# Patient Record
Sex: Female | Born: 1975 | Race: White | Hispanic: No | Marital: Married | Smoking: Never smoker
Health system: Southern US, Community
[De-identification: ages and names within clinical notes are randomized; demographics above are authoritative.]

## PROBLEM LIST (undated history)

## (undated) DIAGNOSIS — R079 Chest pain, unspecified: Secondary | ICD-10-CM

## (undated) DIAGNOSIS — F419 Anxiety disorder, unspecified: Secondary | ICD-10-CM

## (undated) DIAGNOSIS — K829 Disease of gallbladder, unspecified: Secondary | ICD-10-CM

## (undated) DIAGNOSIS — J302 Other seasonal allergic rhinitis: Secondary | ICD-10-CM

## (undated) DIAGNOSIS — E039 Hypothyroidism, unspecified: Secondary | ICD-10-CM

## (undated) DIAGNOSIS — T7840XA Allergy, unspecified, initial encounter: Secondary | ICD-10-CM

## (undated) DIAGNOSIS — G4730 Sleep apnea, unspecified: Secondary | ICD-10-CM

## (undated) DIAGNOSIS — E079 Disorder of thyroid, unspecified: Secondary | ICD-10-CM

## (undated) DIAGNOSIS — H269 Unspecified cataract: Secondary | ICD-10-CM

## (undated) DIAGNOSIS — I471 Supraventricular tachycardia, unspecified: Secondary | ICD-10-CM

## (undated) DIAGNOSIS — I493 Ventricular premature depolarization: Secondary | ICD-10-CM

## (undated) DIAGNOSIS — M2550 Pain in unspecified joint: Secondary | ICD-10-CM

## (undated) DIAGNOSIS — G4733 Obstructive sleep apnea (adult) (pediatric): Secondary | ICD-10-CM

## (undated) DIAGNOSIS — M549 Dorsalgia, unspecified: Secondary | ICD-10-CM

## (undated) DIAGNOSIS — R002 Palpitations: Secondary | ICD-10-CM

## (undated) DIAGNOSIS — E559 Vitamin D deficiency, unspecified: Secondary | ICD-10-CM

## (undated) DIAGNOSIS — E669 Obesity, unspecified: Secondary | ICD-10-CM

## (undated) DIAGNOSIS — C801 Malignant (primary) neoplasm, unspecified: Secondary | ICD-10-CM

## (undated) DIAGNOSIS — D649 Anemia, unspecified: Secondary | ICD-10-CM

## (undated) HISTORY — DX: Palpitations: R00.2

## (undated) HISTORY — DX: Dorsalgia, unspecified: M54.9

## (undated) HISTORY — DX: Morbid (severe) obesity due to excess calories: E66.01

## (undated) HISTORY — DX: Obstructive sleep apnea (adult) (pediatric): G47.33

## (undated) HISTORY — DX: Chest pain, unspecified: R07.9

## (undated) HISTORY — DX: Anemia, unspecified: D64.9

## (undated) HISTORY — PX: SPINE SURGERY: SHX786

## (undated) HISTORY — DX: Supraventricular tachycardia: I47.1

## (undated) HISTORY — DX: Unspecified cataract: H26.9

## (undated) HISTORY — DX: Other seasonal allergic rhinitis: J30.2

## (undated) HISTORY — DX: Supraventricular tachycardia, unspecified: I47.10

## (undated) HISTORY — DX: Sleep apnea, unspecified: G47.30

## (undated) HISTORY — DX: Hypothyroidism, unspecified: E03.9

## (undated) HISTORY — DX: Vitamin D deficiency, unspecified: E55.9

## (undated) HISTORY — DX: Allergy, unspecified, initial encounter: T78.40XA

## (undated) HISTORY — DX: Anxiety disorder, unspecified: F41.9

## (undated) HISTORY — DX: Disorder of thyroid, unspecified: E07.9

## (undated) HISTORY — DX: Ventricular premature depolarization: I49.3

## (undated) HISTORY — DX: Obesity, unspecified: E66.9

## (undated) HISTORY — DX: Pain in unspecified joint: M25.50

## (undated) HISTORY — DX: Disease of gallbladder, unspecified: K82.9

---

## 2015-03-31 HISTORY — PX: BREAST BIOPSY: SHX20

## 2015-06-12 DIAGNOSIS — E662 Morbid (severe) obesity with alveolar hypoventilation: Secondary | ICD-10-CM | POA: Insufficient documentation

## 2015-07-25 DIAGNOSIS — K828 Other specified diseases of gallbladder: Secondary | ICD-10-CM | POA: Insufficient documentation

## 2015-07-25 HISTORY — DX: Other specified diseases of gallbladder: K82.8

## 2016-03-30 HISTORY — PX: CHOLECYSTECTOMY: SHX55

## 2018-03-30 DIAGNOSIS — C801 Malignant (primary) neoplasm, unspecified: Secondary | ICD-10-CM

## 2018-03-30 DIAGNOSIS — C4491 Basal cell carcinoma of skin, unspecified: Secondary | ICD-10-CM

## 2018-03-30 HISTORY — DX: Basal cell carcinoma of skin, unspecified: C44.91

## 2018-03-30 HISTORY — DX: Malignant (primary) neoplasm, unspecified: C80.1

## 2018-03-30 HISTORY — PX: MOHS SURGERY: SUR867

## 2019-08-21 DIAGNOSIS — D2339 Other benign neoplasm of skin of other parts of face: Secondary | ICD-10-CM | POA: Diagnosis not present

## 2019-08-21 DIAGNOSIS — L821 Other seborrheic keratosis: Secondary | ICD-10-CM | POA: Diagnosis not present

## 2019-08-21 DIAGNOSIS — Z0000 Encounter for general adult medical examination without abnormal findings: Secondary | ICD-10-CM | POA: Diagnosis not present

## 2019-08-21 DIAGNOSIS — L814 Other melanin hyperpigmentation: Secondary | ICD-10-CM | POA: Diagnosis not present

## 2019-08-21 DIAGNOSIS — Z7189 Other specified counseling: Secondary | ICD-10-CM | POA: Diagnosis not present

## 2019-09-29 DIAGNOSIS — E785 Hyperlipidemia, unspecified: Secondary | ICD-10-CM | POA: Diagnosis not present

## 2019-09-29 DIAGNOSIS — E559 Vitamin D deficiency, unspecified: Secondary | ICD-10-CM | POA: Diagnosis not present

## 2019-09-29 DIAGNOSIS — F418 Other specified anxiety disorders: Secondary | ICD-10-CM | POA: Diagnosis not present

## 2020-02-09 ENCOUNTER — Other Ambulatory Visit: Payer: Self-pay

## 2020-02-09 ENCOUNTER — Telehealth: Payer: Self-pay | Admitting: Medical

## 2020-02-09 NOTE — Patient Instructions (Signed)

## 2020-02-09 NOTE — Progress Notes (Addendum)
   Subjective:    Patient ID: Holly Johnson, female    DOB: 1975-08-30, 44 y.o.   MRN: [id number]  HPI 44 yo female in non acute distress consents to telemedicine appointment. She tested positive today with Covid-19. Her children had been ill and they tested postive for Covid -19 on Tuesday11-9. Last night patient had a HA, dizziness and fatigue. She denies fever/chills, cough , shortness of [place] or chest pain or loss of taste or smell. She had contacted [facility] and they guided her to talk to [facility] for guidance on returning to work.   No Known Allergies   PMHx/Meds hypiothyroidism levothyroxine Anxiety Fluoxetine    Review of Systems  Constitutional: Positive for fatigue. Negative for chills and fever.  HENT: Negative.   Respiratory: Negative.   Cardiovascular: Negative.   Neurological: Positive for dizziness and headaches.        Objective:   Physical Exam [id number] No physical examination performed due to telemedicne appointment.       Assessment & Plan:  Covid -19 postiive Symptoms started on 02-07-2020 may return to work on 02-19-2020 if symptoms are improving and patient has  Had no  a fever in 24 hours with no Motrin or Tylneol on board.  Patient verbalizes understanding and has no questions at the end of our conversation. Encouraged patient to set up MyChart for easier communication and to share AVS with patient.

## 2020-02-16 DIAGNOSIS — U071 COVID-19: Secondary | ICD-10-CM | POA: Diagnosis not present

## 2020-05-10 DIAGNOSIS — Z23 Encounter for immunization: Secondary | ICD-10-CM | POA: Diagnosis not present

## 2020-05-13 ENCOUNTER — Other Ambulatory Visit: Payer: Self-pay

## 2020-05-13 ENCOUNTER — Other Ambulatory Visit (HOSPITAL_COMMUNITY)
Admission: RE | Admit: 2020-05-13 | Discharge: 2020-05-13 | Disposition: A | Discharge status: Home / Self Care | Payer: BC Managed Care – PPO | Source: Ambulatory Visit | Attending: Certified Nurse Midwife | Admitting: Certified Nurse Midwife

## 2020-05-13 ENCOUNTER — Ambulatory Visit (INDEPENDENT_AMBULATORY_CARE_PROVIDER_SITE_OTHER): Payer: Self-pay | Admitting: Certified Nurse Midwife

## 2020-05-13 ENCOUNTER — Encounter: Payer: Self-pay | Admitting: Certified Nurse Midwife

## 2020-05-13 VITALS — BP 135/96 | HR 66 | Ht 66.5 in | Wt 273.0 lb

## 2020-05-13 DIAGNOSIS — Z124 Encounter for screening for malignant neoplasm of cervix: Secondary | ICD-10-CM

## 2020-05-13 DIAGNOSIS — Z01419 Encounter for gynecological examination (general) (routine) without abnormal findings: Secondary | ICD-10-CM

## 2020-05-13 DIAGNOSIS — Z975 Presence of (intrauterine) contraceptive device: Secondary | ICD-10-CM

## 2020-05-13 DIAGNOSIS — Z1159 Encounter for screening for other viral diseases: Secondary | ICD-10-CM

## 2020-05-13 DIAGNOSIS — N921 Excessive and frequent menstruation with irregular cycle: Secondary | ICD-10-CM

## 2020-05-13 DIAGNOSIS — Z1231 Encounter for screening mammogram for malignant neoplasm of breast: Secondary | ICD-10-CM

## 2020-05-13 NOTE — Progress Notes (Signed)
ANNUAL PREVENTATIVE CARE GYN  ENCOUNTER NOTE  Subjective:       Holly Johnson is a 45 y.o. [phone] female here for a routine annual gynecologic exam.  Current complaints: 1. Breakthrough bleeding with IUD, occasional since daughter started menses  Establishing care, relocated for [place], [place].   Denies difficulty breathing or respiratory distress, chest pain, abdominal pain, excessive vaginal bleeding, dysuria, and leg pain or swelling.    Gynecologic History  No LMP recorded (lmp unknown). (Menstrual status: IUD).  Contraception: IUD, Mirena; inserted approximately five (5) years ago  Last Pap: due.   Last mammogram: due.   Obstetric History  OB History  Gravida Para Term Preterm AB Living  2 2 2     2   SAB IAB Ectopic Multiple Live Births          2    # Outcome Date GA Lbr Len/2nd Weight Sex Delivery Anes PTL Lv  2 Term 02/14/10   8 lb 4 oz (3.742 kg) F Vag-Spont   LIV  1 Term 06/19/08   7 lb 15 oz (3.6 kg) F Vag-Spont   LIV    Past Medical History:  Diagnosis Date  . Anxiety   . Thyroid disease     Past Surgical History:  Procedure Laterality Date  . BREAST BIOPSY Right   . GALLBLADDER SURGERY    . MOHS SURGERY     x 3    Current Outpatient Medications on File Prior to Visit  Medication Sig Dispense Refill  . BIOTIN PO Take by mouth.    Marland Kitchen FLUoxetine (PROZAC) 10 MG tablet Take 10 mg by mouth daily.    Marland Kitchen levonorgestrel (MIRENA) 20 MCG/24HR IUD 1 each by Intrauterine route once.    Marland Kitchen levothyroxine (SYNTHROID) 88 MCG tablet Take 88 mcg by mouth daily before breakfast.    . VITAMIN D PO Take by mouth.     No current facility-administered medications on file prior to visit.    No Known Allergies  Social History   Socioeconomic History  . Marital status: Unknown    Spouse name: Not on file  . Number of children: Not on file  . Years of education: Not on file  . Highest education level: Not on file  Occupational History  . Not on file  Tobacco  Use  . Smoking status: Never Smoker  . Smokeless tobacco: Never Used  Vaping Use  . Vaping Use: Never used  Substance and Sexual Activity  . Alcohol use: Yes    Comment: occass  . Drug use: Never  . Sexual activity: Yes    Birth control/protection: I.U.D.  Other Topics Concern  . Not on file  Social History Narrative  . Not on file   Social Determinants of Health   Financial Resource Strain: Not on file  Food Insecurity: Not on file  Transportation Needs: Not on file  Physical Activity: Not on file  Stress: Not on file  Social Connections: Not on file  Intimate Partner Violence: Not on file    Family History  Problem Relation Age of Onset  . Hypertension Mother   . Diabetes Mother   . Osteoarthritis Mother   . Hyperlipidemia Mother   . Skin cancer Mother   . Hypertension Father   . Hyperlipidemia Father   . Skin cancer Father   . Gallbladder disease Father   . Cancer Father        gallbladder cancer  . Heart disease Maternal Grandmother   .  Hypertension Maternal Grandmother   . Osteoarthritis Maternal Grandmother   . Thyroid disease Maternal Grandmother   . Lung cancer Maternal Grandfather   . Ovarian cancer Paternal Grandmother   . Dementia Paternal Grandfather     The following portions of the patient's history were reviewed and updated as appropriate: allergies, current medications, past family history, past medical history, past social history, past surgical history and problem list.  Review of Systems  ROS negative except as noted above. Information obtained from patient.    Objective:   BP (!) 135/96   Pulse 66   Ht 5' 6.5" (1.689 m)   Wt 273 lb (123.8 kg)   LMP  (LMP Unknown)   BMI 43.40 kg/m    CONSTITUTIONAL: Well-developed, well-nourished female in no acute distress.   PSYCHIATRIC: Normal mood and affect. Normal behavior. Normal judgment and thought content.  [place]: Alert and oriented to person, place, and time. Normal muscle tone  coordination. No cranial nerve  deficit noted.  HENT:  Normocephalic, atraumatic.  EYES: Conjunctivae and EOM are normal.   NECK: Normal range of motion, supple, no masses.  SKIN: Skin is warm and dry. No rash noted. Not diaphoretic. No erythema. No pallor.  CARDIOVASCULAR: Normal heart rate noted, regular rhythm, no murmur.  RESPIRATORY: Clear to auscultation bilaterally. Effort and breath sounds normal, no problems with respiration noted.  BREASTS: Symmetric in size. No masses, skin changes, nipple drainage, or lymphadenopathy.  ABDOMEN: Soft, normal bowel sounds, no distention noted.  No tenderness, rebound or guarding.   PELVIC:  External Genitalia: Normal  Vagina: Normal  Cervix: Normal, IUD string present, Pap collected  Uterus: Normal  Adnexa: Normal   MUSCULOSKELETAL: Normal range of motion. No tenderness.  No cyanosis, clubbing, or edema.  2+ distal pulses.  LYMPHATIC: No Axillary, Supraclavicular, or Inguinal Adenopathy.  Assessment:   Annual gynecologic examination 44 y.o.   Contraception: IUD, Mirena   Obesity 3   Problem List Items Addressed This Visit   None   Visit Diagnoses    Well woman exam    -  Primary   Relevant Orders   MM 3D SCREEN BREAST BILATERAL   Need for hepatitis C screening test       Relevant Orders   Hepatitis C antibody   Cervical cancer screening       Relevant Orders   Cytology - PAP   IUD (intrauterine device) in place       Screening mammogram for breast cancer       Relevant Orders   MM 3D SCREEN BREAST BILATERAL      Plan:   Pap: Pap Co Test   Mammogram: Ordered   Labs: Managed by PCP  Routine preventative health maintenance measures emphasized: Exercise/Diet/Weight control, Tobacco Warnings, Alcohol/Substance use risks and Stress Management; see AVS  Reviewed red flag symptoms and when to call  Return to Clinic - 1 Year for [employer] or sooner if needed   Dani Gobble, CNM  [facility],  [facility] 05/13/20 3:41 PM

## 2020-05-13 NOTE — Progress Notes (Signed)
NP-Est care-pt stated that she was doing well. Pt is currently using Mirena for birth control and think it has been 5 year since it has been inserted.

## 2020-05-13 NOTE — Patient Instructions (Signed)
Levonorgestrel intrauterine device (IUD) What is this medicine? LEVONORGESTREL IUD (LEE voe nor jes trel) is a contraceptive (birth control) device. The device is placed inside the uterus by a health care provider. It is used to prevent pregnancy. Some devices can also be used to treat heavy bleeding that occurs during your period. This medicine may be used for other purposes; ask your health care provider or pharmacist if you have questions. COMMON BRAND NAME(S): Minette Headland What should I tell my health care provider before I take this medicine? They need to know if you have any of these conditions:  abnormal Pap smear  cancer of the breast, uterus, or cervix  diabetes  endometritis  genital or pelvic infection now or in the past  have more than one sexual partner or your partner has more than one partner  heart disease  history of an ectopic or tubal pregnancy  immune system problems  IUD in place  liver disease or tumor  problems with blood clots or take blood-thinners  seizures  use intravenous drugs  uterus of unusual shape  vaginal bleeding that has not been explained  an unusual or allergic reaction to levonorgestrel, other hormones, silicone, or polyethylene, medicines, foods, dyes, or preservatives  pregnant or trying to get pregnant  breast-feeding How should I use this medicine? This device is placed inside the uterus by a health care professional. Talk to your pediatrician regarding the use of this medicine in children. Special care may be needed. Overdosage: If you think you have taken too much of this medicine contact a poison control center or emergency room at once. NOTE: This medicine is only for you. Do not share this medicine with others. What if I miss a dose? This does not apply. Depending on the brand of device you have inserted, the device will need to be replaced every 3 to 7 years if you wish to continue using this type  of birth control. What may interact with this medicine? Do not take this medicine with any of the following medications:  amprenavir  bosentan  fosamprenavir This medicine may also interact with the following medications:  aprepitant  armodafinil  barbiturate medicines for inducing sleep or treating seizures  bexarotene  boceprevir  griseofulvin  medicines to treat seizures like carbamazepine, ethotoin, felbamate, oxcarbazepine, phenytoin, topiramate  modafinil  pioglitazone  rifabutin  rifampin  rifapentine  some medicines to treat HIV infection like atazanavir, efavirenz, indinavir, lopinavir, nelfinavir, tipranavir, ritonavir  St. John's wort  warfarin This list may not describe all possible interactions. Give your health care provider a list of all the medicines, herbs, non-prescription drugs, or dietary supplements you use. Also tell them if you smoke, drink alcohol, or use illegal drugs. Some items may interact with your medicine. What should I watch for while using this medicine? Visit your doctor or health care professional for regular check ups. See your doctor if you or your partner has sexual contact with others, becomes HIV positive, or gets a sexual transmitted disease. This product does not protect you against HIV infection (AIDS) or other sexually transmitted diseases. You can check the placement of the IUD yourself by reaching up to the top of your vagina with clean fingers to feel the threads. Do not pull on the threads. It is a good habit to check placement after each menstrual period. Call your doctor right away if you feel more of the IUD than just the threads or if you cannot feel the threads  at all. The IUD may come out by itself. You may become pregnant if the device comes out. If you notice that the IUD has come out use a backup birth control method like condoms and call your health care provider. Using tampons will not change the position of the  IUD and are okay to use during your period. This IUD can be safely scanned with magnetic resonance imaging (MRI) only under specific conditions. Before you have an MRI, tell your healthcare provider that you have an IUD in place, and which type of IUD you have in place. What side effects may I notice from receiving this medicine? Side effects that you should report to your doctor or health care professional as soon as possible:  allergic reactions like skin rash, itching or hives, swelling of the face, lips, or tongue  fever, flu-like symptoms  genital sores  high blood pressure  no menstrual period for 6 weeks during use  pain, swelling, warmth in the leg  pelvic pain or tenderness  severe or sudden headache  signs of pregnancy  stomach cramping  sudden shortness of breath  trouble with balance, talking, or walking  unusual vaginal bleeding, discharge  yellowing of the eyes or skin Side effects that usually do not require medical attention (report to your doctor or health care professional if they continue or are bothersome):  acne  breast pain  change in sex drive or performance  changes in weight  cramping, dizziness, or faintness while the device is being inserted  headache  irregular menstrual bleeding within first 3 to 6 months of use  nausea This list may not describe all possible side effects. Call your doctor for medical advice about side effects. You may report side effects to FDA at 1-800-FDA-1088. Where should I keep my medicine? This does not apply. NOTE: This sheet is a summary. It may not cover all possible information. If you have questions about this medicine, talk to your doctor, pharmacist, or health care provider.  2021 Elsevier/Gold Standard (2019-11-14 16:27:45)   Preventive Care 40-64 Years Old, Female Preventive care refers to lifestyle choices and visits with your health care provider that can promote health and wellness. This  includes:  A yearly physical exam. This is also called an annual wellness visit.  Regular dental and eye exams.  Immunizations.  Screening for certain conditions.  Healthy lifestyle choices, such as: ? Eating a healthy diet. ? Getting regular exercise. ? Not using drugs or products that contain nicotine and tobacco. ? Limiting alcohol use. What can I expect for my preventive care visit? Physical exam Your health care provider will check your:  Height and weight. These may be used to calculate your BMI (body mass index). BMI is a measurement that tells if you are at a healthy weight.  Heart rate and blood pressure.  Body temperature.  Skin for abnormal spots. Counseling Your health care provider may ask you questions about your:  Past medical problems.  Family's medical history.  Alcohol, tobacco, and drug use.  Emotional well-being.  Home life and relationship well-being.  Sexual activity.  Diet, exercise, and sleep habits.  Work and work environment.  Access to firearms.  Method of birth control.  Menstrual cycle.  Pregnancy history. What immunizations do I need? Vaccines are usually given at various ages, according to a schedule. Your health care provider will recommend vaccines for you based on your age, medical history, and lifestyle or other factors, such as travel or where you   work.   What tests do I need? Blood tests  Lipid and cholesterol levels. These may be checked every 5 years, or more often if you are over 50 years old.  Hepatitis C test.  Hepatitis B test. Screening  Lung cancer screening. You may have this screening every year starting at age 55 if you have a 30-pack-year history of smoking and currently smoke or have quit within the past 15 years.  Colorectal cancer screening. ? All adults should have this screening starting at age 50 and continuing until age 75. ? Your health care provider may recommend screening at age 45 if you  are at increased risk. ? You will have tests every 1-10 years, depending on your results and the type of screening test.  Diabetes screening. ? This is done by checking your blood sugar (glucose) after you have not eaten for a while (fasting). ? You may have this done every 1-3 years.  Mammogram. ? This may be done every 1-2 years. ? Talk with your health care provider about when you should start having regular mammograms. This may depend on whether you have a family history of breast cancer.  BRCA-related cancer screening. This may be done if you have a family history of breast, ovarian, tubal, or peritoneal cancers.  Pelvic exam and Pap test. ? This may be done every 3 years starting at age 21. ? Starting at age 30, this may be done every 5 years if you have a Pap test in combination with an HPV test. Other tests  STD (sexually transmitted disease) testing, if you are at risk.  Bone density scan. This is done to screen for osteoporosis. You may have this scan if you are at high risk for osteoporosis. Talk with your health care provider about your test results, treatment options, and if necessary, the need for more tests. Follow these instructions at home: Eating and drinking  Eat a diet that includes fresh fruits and vegetables, whole grains, lean protein, and low-fat dairy products.  Take vitamin and mineral supplements as recommended by your health care provider.  Do not drink alcohol if: ? Your health care provider tells you not to drink. ? You are pregnant, may be pregnant, or are planning to become pregnant.  If you drink alcohol: ? Limit how much you have to 0-1 drink a day. ? Be aware of how much alcohol is in your drink. In the [place] one drink equals one 12 oz bottle of beer (355 mL), one 5 oz glass of wine (148 mL), or one 1 oz glass of hard liquor (44 mL).   Lifestyle  Take daily care of your teeth and gums. Brush your teeth every morning and night with fluoride  toothpaste. Floss one time each day.  Stay active. Exercise for at least 30 minutes 5 or more days each week.  Do not use any products that contain nicotine or tobacco, such as cigarettes, e-cigarettes, and chewing tobacco. If you need help quitting, ask your health care provider.  Do not use drugs.  If you are sexually active, practice safe sex. Use a condom or other form of protection to prevent STIs (sexually transmitted infections).  If you do not wish to become pregnant, use a form of birth control. If you plan to become pregnant, see your health care provider for a prepregnancy visit.  If told by your health care provider, take low-dose aspirin daily starting at age 50.  Find healthy ways to cope with stress,   such as: ? Meditation, yoga, or listening to music. ? Journaling. ? Talking to a trusted person. ? Spending time with friends and family. Safety  Always wear your seat belt while driving or riding in a vehicle.  Do not drive: ? If you have been drinking alcohol. Do not ride with someone who has been drinking. ? When you are tired or distracted. ? While texting.  Wear a helmet and other protective equipment during sports activities.  If you have firearms in your house, make sure you follow all gun safety procedures. What's next?  Visit your health care provider once a year for an annual wellness visit.  Ask your health care provider how often you should have your eyes and teeth checked.  Stay up to date on all vaccines. This information is not intended to replace advice given to you by your health care provider. Make sure you discuss any questions you have with your health care provider. Document Revised: 12/19/2019 Document Reviewed: 11/25/2017 Elsevier Patient Education  2021 Elsevier Inc.  

## 2020-05-16 LAB — CYTOLOGY - PAP
Comment: NEGATIVE
Diagnosis: NEGATIVE
High risk HPV: NEGATIVE

## 2020-05-23 NOTE — Addendum Note (Signed)
Addended by: Cherre Huger on: 05/23/2020 08:58 AM   Modules accepted: Orders

## 2020-05-28 ENCOUNTER — Other Ambulatory Visit: Payer: Self-pay

## 2020-05-28 ENCOUNTER — Ambulatory Visit
Admission: RE | Admit: 2020-05-28 | Discharge: 2020-05-28 | Disposition: A | Discharge status: Home / Self Care | Payer: BC Managed Care – PPO | Source: Ambulatory Visit | Attending: Certified Nurse Midwife | Admitting: Certified Nurse Midwife

## 2020-05-28 DIAGNOSIS — Z1231 Encounter for screening mammogram for malignant neoplasm of breast: Secondary | ICD-10-CM | POA: Insufficient documentation

## 2020-05-28 DIAGNOSIS — Z01419 Encounter for gynecological examination (general) (routine) without abnormal findings: Secondary | ICD-10-CM

## 2020-05-28 HISTORY — DX: Malignant (primary) neoplasm, unspecified: C80.1

## 2020-06-12 ENCOUNTER — Other Ambulatory Visit: Payer: Self-pay | Admitting: Certified Nurse Midwife

## 2020-06-12 DIAGNOSIS — R928 Other abnormal and inconclusive findings on diagnostic imaging of breast: Secondary | ICD-10-CM

## 2020-06-12 DIAGNOSIS — N6310 Unspecified lump in the right breast, unspecified quadrant: Secondary | ICD-10-CM

## 2020-06-13 NOTE — Progress Notes (Signed)
Orders signed, see chart.    Dani Gobble, CNM [facility], [facility] 06/13/20 11:35 AM

## 2020-07-08 ENCOUNTER — Ambulatory Visit
Admission: RE | Admit: 2020-07-08 | Discharge: 2020-07-08 | Disposition: A | Discharge status: Home / Self Care | Payer: BC Managed Care – PPO | Source: Ambulatory Visit | Attending: Certified Nurse Midwife | Admitting: Certified Nurse Midwife

## 2020-07-08 ENCOUNTER — Other Ambulatory Visit: Payer: Self-pay

## 2020-07-08 DIAGNOSIS — R928 Other abnormal and inconclusive findings on diagnostic imaging of breast: Secondary | ICD-10-CM | POA: Diagnosis not present

## 2020-07-08 DIAGNOSIS — N6310 Unspecified lump in the right breast, unspecified quadrant: Secondary | ICD-10-CM | POA: Insufficient documentation

## 2020-07-08 DIAGNOSIS — N6311 Unspecified lump in the right breast, upper outer quadrant: Secondary | ICD-10-CM | POA: Diagnosis not present

## 2020-07-08 DIAGNOSIS — R922 Inconclusive mammogram: Secondary | ICD-10-CM | POA: Diagnosis not present

## 2020-12-18 ENCOUNTER — Other Ambulatory Visit: Payer: Self-pay

## 2020-12-18 ENCOUNTER — Ambulatory Visit: Payer: Self-pay

## 2020-12-18 DIAGNOSIS — Z23 Encounter for immunization: Secondary | ICD-10-CM

## 2020-12-24 ENCOUNTER — Ambulatory Visit: Payer: BC Managed Care – PPO | Admitting: Family Medicine

## 2020-12-24 ENCOUNTER — Encounter: Payer: Self-pay | Admitting: Family Medicine

## 2020-12-24 ENCOUNTER — Other Ambulatory Visit: Payer: Self-pay

## 2020-12-24 VITALS — BP 122/84 | HR 65 | Temp 98.0°F | Ht 66.5 in | Wt 273.0 lb

## 2020-12-24 DIAGNOSIS — R7989 Other specified abnormal findings of blood chemistry: Secondary | ICD-10-CM

## 2020-12-24 DIAGNOSIS — Z23 Encounter for immunization: Secondary | ICD-10-CM | POA: Diagnosis not present

## 2020-12-24 DIAGNOSIS — E039 Hypothyroidism, unspecified: Secondary | ICD-10-CM | POA: Insufficient documentation

## 2020-12-24 DIAGNOSIS — R0683 Snoring: Secondary | ICD-10-CM

## 2020-12-24 DIAGNOSIS — Z13220 Encounter for screening for lipoid disorders: Secondary | ICD-10-CM | POA: Diagnosis not present

## 2020-12-24 DIAGNOSIS — Z85828 Personal history of other malignant neoplasm of skin: Secondary | ICD-10-CM | POA: Diagnosis not present

## 2020-12-24 DIAGNOSIS — R002 Palpitations: Secondary | ICD-10-CM | POA: Diagnosis not present

## 2020-12-24 DIAGNOSIS — Z1211 Encounter for screening for malignant neoplasm of colon: Secondary | ICD-10-CM | POA: Insufficient documentation

## 2020-12-24 DIAGNOSIS — F419 Anxiety disorder, unspecified: Secondary | ICD-10-CM

## 2020-12-24 DIAGNOSIS — Z9889 Other specified postprocedural states: Secondary | ICD-10-CM

## 2020-12-24 NOTE — Assessment & Plan Note (Addendum)
This is a chronic issue that has had previous work-up including Holter monitor revealing PVCs through cardiology group in [place], [place].  Symptoms had been absent until the past 3 months, since that time has noted return of symptoms, different per patient, at times associated with pain, lasts momentarily.  The symptoms are not correlated to specific activities, can occur at rest or with exertion, no radiation of symptoms.  Examination reveals no thyromegaly or nodularity, regular rate and rhythm, positive S1-S2, no additional heart sounds, clear lung fields throughout, symmetric regular pulses in the extremities, no peripheral edema.  EKG was obtained in office today showing sinus bradycardia.  Given her history, risk stratification labs to be ordered, referral to cardiology, and further evaluation by ENT/sleep medicine due to snoring which may play a role into the etiology behind her palpitations.  Patient is understanding and amenable to this plan moving forward.

## 2020-12-24 NOTE — Assessment & Plan Note (Signed)
See additional assessment(s) for plan details.

## 2020-12-24 NOTE — Assessment & Plan Note (Signed)
Patient has had a history of basal cell carcinoma requiring Mohs surgery, she denies any active issues currently.  I have placed a referral to dermatology for her to establish care for continued skin monitoring and management.  We will follow on this issue peripherally.

## 2020-12-24 NOTE — Assessment & Plan Note (Addendum)
Longstanding issue currently treated with Synthroid.  She has been noting progressive palpitations, plan to recheck levels to ensure adequate treatment.

## 2020-12-24 NOTE — Progress Notes (Signed)
Primary Care / Sports Medicine Office Visit  Patient Information:  Patient ID: Holly Johnson, female DOB: June 26, 1975 Age: 45 y.o. MRN: [id number]   Holly Johnson is a pleasant 45 y.o. 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; no chest pain associated, but chest tightness intermittently    Review of Systems pertinent details above   Patient Active Problem List   Diagnosis Date Noted   Acquired hypothyroidism 12/24/2020   Palpitations 12/24/2020   History of Mohs micrographic surgery for skin cancer 12/24/2020   History of basal cell carcinoma (BCC) 12/24/2020   Colon cancer screening 12/24/2020   Anxiety    Obesity, Class III, BMI 40-49.9 (morbid obesity) ([place]) 06/12/2015   Past Medical History:  Diagnosis Date   Allergy    Anxiety    Biliary dyskinesia 07/25/2015   Cancer ([place]) 2020   basal cell carcinoma   Outpatient Encounter Medications as of 12/24/2020  Medication Sig   cetirizine (ZYRTEC) 10 MG tablet Take 10 mg by mouth daily.   FLUoxetine (PROZAC) 10 MG tablet Take 10 mg by mouth daily.   levonorgestrel (MIRENA) 20 MCG/24HR IUD 1 each by Intrauterine route once.   levothyroxine (SYNTHROID) 88 MCG tablet Take 88 mcg by mouth daily before breakfast. Brand only   VITAMIN D PO Take 2,000 Units by mouth daily.   [DISCONTINUED] albuterol (VENTOLIN HFA) 108 (90 Base) MCG/ACT inhaler Inhale 2 puffs into the lungs every 6 (six) hours as needed.   [DISCONTINUED] BIOTIN PO Take by mouth.   No facility-administered encounter medications on file as of 12/24/2020.   Past Surgical History:  Procedure Laterality Date   BREAST BIOPSY Right  2017   fibroadenoma   CHOLECYSTECTOMY  2018   MOHS SURGERY  2020   x 3    Vitals:   12/24/20 0820  BP: 122/84  Pulse: 65  Temp: 98 F (36.7 C)  SpO2: 97%   Vitals:   12/24/20 0820  Weight: 273 lb (123.8 kg)  Height: 5' 6.5" (1.689 m)   Body mass index is 43.4 kg/m.  No results found.   Independent interpretation of notes and tests performed by another provider:   None  Procedures performed:   None  Pertinent History, Exam, Impression, and Recommendations:   History of basal cell carcinoma (BCC) Patient has had a history of basal cell carcinoma requiring Mohs surgery, she denies any active issues currently.  I have placed a referral to dermatology for her to establish care for continued skin monitoring and management.  We will follow on this issue peripherally.  Acquired hypothyroidism Longstanding issue currently treated with Synthroid.  She has been noting progressive palpitations, plan to recheck levels to ensure adequate treatment.  History of Mohs micrographic surgery for skin cancer See additional assessment(s) for plan details.  Anxiety Current PHQ and GAD scores are reassuring, has been stable on current medication regiment.  Can possibly contribute towards palpitations though we will rule out alternative etiologies before considering titration of her fluoxetine.  Palpitations This is a chronic issue that has had previous work-up including Holter monitor revealing PVCs through cardiology group in [place],  [place].  Symptoms had been absent until the past 3 months, since that time has noted return of symptoms, different per patient, at times associated with pain, lasts momentarily.  The symptoms are not correlated to specific activities, can occur at rest or with exertion, no radiation of symptoms.  Examination reveals no thyromegaly or nodularity, regular rate and rhythm, positive S1-S2, no additional heart sounds, clear lung fields throughout, symmetric regular  pulses in the extremities, no peripheral edema.  EKG was obtained in office today showing sinus bradycardia.  Given her history, risk stratification labs to be ordered, referral to cardiology, and further evaluation by ENT/sleep medicine due to snoring which may play a role into the etiology behind her palpitations.  Patient is understanding and amenable to this plan moving forward.  Colon cancer screening I reviewed various colon cancer screening options and she is opted for colonoscopy, referral to GI was placed for routine colon cancer screening.   Orders & Medications No orders of the defined types were placed in this encounter.  Orders Placed This Encounter  Procedures   Tdap vaccine greater than or equal to 7yo IM   Lipid panel   B12 and Folate Panel   Iron, TIBC and Ferritin Panel   CBC with Differential/Platelet   Comprehensive metabolic panel   T4, free   T3, free   TSH   VITAMIN D 25 Hydroxy (Vit-D Deficiency, Fractures)   Ambulatory referral to Dermatology   Ambulatory referral to Gastroenterology   Ambulatory referral to Cardiology   Ambulatory referral to ENT   EKG 12-Lead     No follow-ups on file.     Montel Culver, MD   Primary Care Sports Medicine [place]

## 2020-12-24 NOTE — Assessment & Plan Note (Signed)
I reviewed various colon cancer screening options and she is opted for colonoscopy, referral to GI was placed for routine colon cancer screening.

## 2020-12-24 NOTE — Assessment & Plan Note (Signed)
Current PHQ and GAD scores are reassuring, has been stable on current medication regiment.  Can possibly contribute towards palpitations though we will rule out alternative etiologies before considering titration of her fluoxetine.

## 2020-12-25 ENCOUNTER — Telehealth: Payer: Self-pay

## 2020-12-25 LAB — COMPREHENSIVE METABOLIC PANEL
ALT: 64 IU/L — ABNORMAL HIGH (ref 0–32)
AST: 46 IU/L — ABNORMAL HIGH (ref 0–40)
Albumin/Globulin Ratio: 2 (ref 1.2–2.2)
Albumin: 4.5 g/dL (ref 3.8–4.8)
Alkaline Phosphatase: 68 IU/L (ref 44–121)
BUN/Creatinine Ratio: 11 (ref 9–23)
BUN: 10 mg/dL (ref 6–24)
Bilirubin Total: 0.6 mg/dL (ref 0.0–1.2)
CO2: 23 mmol/L (ref 20–29)
Calcium: 9.5 mg/dL (ref 8.7–10.2)
Chloride: 102 mmol/L (ref 96–106)
Creatinine, Ser: 0.88 mg/dL (ref 0.57–1.00)
Globulin, Total: 2.2 g/dL (ref 1.5–4.5)
Glucose: 89 mg/dL (ref 70–99)
Potassium: 4.5 mmol/L (ref 3.5–5.2)
Sodium: 138 mmol/L (ref 134–144)
Total Protein: 6.7 g/dL (ref 6.0–8.5)
eGFR: 83 mL/min/{1.73_m2} (ref >59)

## 2020-12-25 LAB — CBC WITH DIFFERENTIAL/PLATELET
Basophils Absolute: 0.1 10*3/uL (ref 0.0–0.2)
Basos: 1 %
EOS (ABSOLUTE): 0.4 10*3/uL (ref 0.0–0.4)
Eos: 6 %
Hematocrit: 44.2 % (ref 34.0–46.6)
Hemoglobin: 14.9 g/dL (ref 11.1–15.9)
Immature Grans (Abs): 0 10*3/uL (ref 0.0–0.1)
Immature Granulocytes: 0 %
Lymphocytes Absolute: 2.6 10*3/uL (ref 0.7–3.1)
Lymphs: 38 %
MCH: 29.7 pg (ref 26.6–33.0)
MCHC: 33.7 g/dL (ref 31.5–35.7)
MCV: 88 fL (ref 79–97)
Monocytes Absolute: 0.4 10*3/uL (ref 0.1–0.9)
Monocytes: 6 %
Neutrophils Absolute: 3.3 10*3/uL (ref 1.4–7.0)
Neutrophils: 49 %
Platelets: 281 10*3/uL (ref 150–450)
RBC: 5.01 x10E6/uL (ref 3.77–5.28)
RDW: 11.8 % (ref 11.7–15.4)
WBC: 6.8 10*3/uL (ref 3.4–10.8)

## 2020-12-25 LAB — IRON,TIBC AND FERRITIN PANEL
Ferritin: 138 ng/mL (ref 15–150)
Iron Saturation: 38 % (ref 15–55)
Iron: 123 ug/dL (ref 27–159)
Total Iron Binding Capacity: 326 ug/dL (ref 250–450)
UIBC: 203 ug/dL (ref 131–425)

## 2020-12-25 LAB — LIPID PANEL
Chol/HDL Ratio: 5.2 ratio — ABNORMAL HIGH (ref 0.0–4.4)
Cholesterol, Total: 209 mg/dL — ABNORMAL HIGH (ref 100–199)
HDL: 40 mg/dL (ref >39)
LDL Chol Calc (NIH): 143 mg/dL — ABNORMAL HIGH (ref 0–99)
Triglycerides: 146 mg/dL (ref 0–149)
VLDL Cholesterol Cal: 26 mg/dL (ref 5–40)

## 2020-12-25 LAB — T3, FREE: T3, Free: 3.1 pg/mL (ref 2.0–4.4)

## 2020-12-25 LAB — B12 AND FOLATE PANEL
Folate: 7.8 ng/mL (ref >3.0)
Vitamin B-12: 314 pg/mL (ref 232–1245)

## 2020-12-25 LAB — T4, FREE: Free T4: 1.57 ng/dL (ref 0.82–1.77)

## 2020-12-25 LAB — VITAMIN D 25 HYDROXY (VIT D DEFICIENCY, FRACTURES): Vit D, 25-Hydroxy: 26.9 ng/mL — ABNORMAL LOW (ref 30.0–100.0)

## 2020-12-25 LAB — TSH: TSH: 2.77 u[IU]/mL (ref 0.450–4.500)

## 2020-12-25 NOTE — Telephone Encounter (Signed)
Returned patients call. Unable to leave message.

## 2020-12-25 NOTE — Telephone Encounter (Signed)
Pt. Calling to schedule colonoscopy

## 2020-12-26 ENCOUNTER — Encounter: Payer: Self-pay | Admitting: Family Medicine

## 2020-12-26 ENCOUNTER — Other Ambulatory Visit: Payer: Self-pay | Admitting: Family Medicine

## 2020-12-26 ENCOUNTER — Telehealth: Payer: Self-pay

## 2020-12-26 DIAGNOSIS — F419 Anxiety disorder, unspecified: Secondary | ICD-10-CM

## 2020-12-26 DIAGNOSIS — E039 Hypothyroidism, unspecified: Secondary | ICD-10-CM

## 2020-12-26 DIAGNOSIS — R7989 Other specified abnormal findings of blood chemistry: Secondary | ICD-10-CM

## 2020-12-26 MED ORDER — FLUOXETINE HCL 10 MG PO TABS: 10.0000 mg | ORAL_TABLET | Freq: Every day | ORAL | 0 refills | Status: DC

## 2020-12-26 MED ORDER — VITAMIN D (ERGOCALCIFEROL) 1.25 MG (50000 UNIT) PO CAPS: 50000.0000 [IU] | ORAL_CAPSULE | ORAL | 0 refills | Status: DC

## 2020-12-26 MED ORDER — SYNTHROID 88 MCG PO TABS: 88.0000 ug | ORAL_TABLET | Freq: Every day | ORAL | 0 refills | Status: DC

## 2020-12-26 NOTE — Telephone Encounter (Signed)
Please advise for labs and medication management.

## 2020-12-26 NOTE — Progress Notes (Signed)
Letter sent via mychart and mailed.

## 2020-12-26 NOTE — Telephone Encounter (Signed)
Hi!  It looks like patient is trying to reach out to you based on the referral messages!

## 2020-12-26 NOTE — Telephone Encounter (Signed)
Copied from [place] [id number]. Topic: General - Other >> Dec 26, 2020 10:20 AM Leward Quan A wrote: Reason for CRM: Patient called in asking for the referral coordinator to please call her upon her earliest convenience. Can be reached at Ph# [phone] or [phone]

## 2020-12-28 ENCOUNTER — Encounter: Payer: Self-pay | Admitting: Family Medicine

## 2020-12-30 DIAGNOSIS — Z23 Encounter for immunization: Secondary | ICD-10-CM | POA: Diagnosis not present

## 2021-01-15 DIAGNOSIS — D225 Melanocytic nevi of trunk: Secondary | ICD-10-CM | POA: Diagnosis not present

## 2021-01-15 DIAGNOSIS — D2330 Other benign neoplasm of skin of unspecified part of face: Secondary | ICD-10-CM | POA: Diagnosis not present

## 2021-01-15 DIAGNOSIS — D485 Neoplasm of uncertain behavior of skin: Secondary | ICD-10-CM | POA: Diagnosis not present

## 2021-01-15 DIAGNOSIS — L578 Other skin changes due to chronic exposure to nonionizing radiation: Secondary | ICD-10-CM | POA: Diagnosis not present

## 2021-01-24 ENCOUNTER — Encounter: Payer: Self-pay | Admitting: Cardiology

## 2021-01-24 ENCOUNTER — Other Ambulatory Visit: Payer: Self-pay

## 2021-01-24 ENCOUNTER — Ambulatory Visit: Payer: BC Managed Care – PPO | Admitting: Cardiology

## 2021-01-24 ENCOUNTER — Ambulatory Visit (INDEPENDENT_AMBULATORY_CARE_PROVIDER_SITE_OTHER): Payer: BC Managed Care – PPO

## 2021-01-24 VITALS — BP 128/86 | HR 68 | Ht 66.5 in | Wt 275.0 lb

## 2021-01-24 DIAGNOSIS — R002 Palpitations: Secondary | ICD-10-CM | POA: Diagnosis not present

## 2021-01-24 NOTE — Progress Notes (Addendum)
Cardiology Office Note:    Date:  01/24/2021   ID:  Holly Johnson, DOB [birth year], MRN [id number]  PCP:  Montel Culver, MD   [facility] Providers Cardiologist:  None     Referring MD: Montel Culver, MD   Chief Complaint  Patient presents with   New Patient (Initial Visit)    Referred by PCP for Palpitations. Meds reviewed verbally with patient.    Holly Johnson is a 45 y.o. female who is being seen today for the evaluation of palpitations at the request of Holly Daniel Earley Abide, MD.   History of Present Illness:    Holly Johnson is a 45 y.o. female with a hx of anxiety, obesity who presents due to palpitations.  She states having symptoms of palpitations ongoing over the past 4 to 5 months.  Symptoms of worsening of late, symptoms occur almost daily.  Symptoms are not associated with dizziness, shortness of breath, syncope.  Denies chest pain.  Does not drink coffee, occasionally drinks diet soda.  Denies prior history of heart disease.  States having history of PVCs several years ago.  Wears a smart watch, has noted occasional PVCs on her smart watch.  Past Medical History:  Diagnosis Date   Allergy    Anxiety    Biliary dyskinesia 07/25/2015   Cancer ([place]) 2020   basal cell carcinoma    Past Surgical History:  Procedure Laterality Date   BREAST BIOPSY Right 2017   fibroadenoma   CHOLECYSTECTOMY  2018   MOHS SURGERY  2020   x 3    Current Medications: Current Meds  Medication Sig   cetirizine (ZYRTEC) 10 MG tablet Take 10 mg by mouth daily.   FLUoxetine (PROZAC) 10 MG tablet Take 1 tablet (10 mg total) by mouth daily.   levonorgestrel (MIRENA) 20 MCG/24HR IUD 1 each by Intrauterine route once.   SYNTHROID 88 MCG tablet Take 1 tablet (88 mcg total) by mouth daily before breakfast.   VITAMIN D PO Take 2,000 Units by mouth daily.   Vitamin D, Ergocalciferol, (DRISDOL) 1.25 MG (50000 UNIT) CAPS capsule Take 1 capsule (50,000 Units total) by mouth  every 7 (seven) days. Take for 8 total doses(weeks)     Allergies:   Patient has no known allergies.   Social History   Socioeconomic History   Marital status: Married    Spouse name: Alesana Magistro   Number of children: 2   Years of education: 20   Highest education level: Doctorate  Occupational History   Not on file  Tobacco Use   Smoking status: Never   Smokeless tobacco: Never  Vaping Use   Vaping Use: Never used  Substance and Sexual Activity   Alcohol use: Yes   Drug use: Never   Sexual activity: Yes    Partners: Male    Birth control/protection: I.U.D.  Other Topics Concern   Not on file  Social History Narrative   Not on file   Social Determinants of Health   Financial Resource Strain: Not on file  Food Insecurity: Not on file  Transportation Needs: Not on file  Physical Activity: Not on file  Stress: Not on file  Social Connections: Not on file     Family History: The patient's family history includes ADD / ADHD in her daughter and sister; Anxiety disorder in her sister; Cancer in her father; Dementia in her paternal grandfather; Diabetes in her mother; Gallbladder disease in her father; Heart disease in her maternal  grandmother; Hyperlipidemia in her father and mother; Hypertension in her father, maternal grandmother, and mother; Lung cancer in her maternal grandfather; Obesity in her mother; Osteoarthritis in her maternal grandmother and mother; Ovarian cancer in her paternal grandmother; Skin cancer in her father and mother; Thyroid disease in her maternal grandmother. There is no history of Breast cancer.  ROS:   Please see the history of present illness.     All other systems reviewed and are negative.  EKGs/Labs/Other Studies Reviewed:    The following studies were reviewed today:   EKG:  EKG is  ordered today.  The ekg ordered today demonstrates normal sinus rhythm, nonspecific ST-T changes.  Recent Labs: 12/24/2020: ALT 64; BUN 10; Creatinine,  Ser 0.88; Hemoglobin 14.9; Platelets 281; Potassium 4.5; Sodium 138; TSH 2.770  Recent Lipid Panel    Component Value Date/Time   CHOL 209 (H) 12/24/2020 0932   TRIG 146 12/24/2020 0932   HDL 40 12/24/2020 0932   CHOLHDL 5.2 (H) 12/24/2020 0932   LDLCALC 143 (H) 12/24/2020 0932     Risk Assessment/Calculations:          Physical Exam:    VS:  BP 128/86 (BP Location: Left Arm, Patient Position: Sitting, Cuff Size: Large)   Pulse 68   Ht 5' 6.5" (1.689 m)   Wt 275 lb (124.7 kg)   SpO2 97%   BMI 43.72 kg/m     Wt Readings from Last 3 Encounters:  01/24/21 275 lb (124.7 kg)  12/24/20 273 lb (123.8 kg)  05/13/20 273 lb (123.8 kg)     GEN:  Well nourished, well developed in no acute distress CARDIAC: RRR, no murmurs, rubs, gallops RESPIRATORY:  Clear to auscultation without rales, wheezing or rhonchi  ABDOMEN: visually appears non-distended MUSCULOSKELETAL:  No edema; No deformity  SKIN: Warm and dry NEUROLOGIC:  Alert and oriented x 3 PSYCHIATRIC:  Normal affect   ASSESSMENT:    1. Palpitations   2. Morbid obesity ([place])    PLAN:    In order of problems listed above:  Palpitations, history of PVCs.  Place cardiac monitor to evaluate any significant arrhythmias. Morbid obesity, low-calorie diet, weight loss advised.  Follow-up after cardiac monitor.   This note was generated in part or whole with voice recognition software. Voice recognition is usually quite accurate but there are transcription errors that can and very often do occur. I apologize for any typographical errors that were not detected and corrected.   Medication Adjustments/Labs and Tests Ordered: Current medicines are reviewed at length with the patient today.  Concerns regarding medicines are outlined above.  Orders Placed This Encounter  Procedures   LONG TERM MONITOR (3-14 DAYS)   EKG 12-Lead    No orders of the defined types were placed in this encounter.   Patient Instructions   Medication Instructions:   Your physician recommends that you continue on your current medications as directed. Please refer to the Current Medication list given to you today.   *If you need a refill on your cardiac medications before your next appointment, please call your pharmacy*   Lab Work: None ordered  If you have labs (blood work) drawn today and your tests are completely normal, you will receive your results only by: [place] (if you have MyChart) OR A paper copy in the mail If you have any lab test that is abnormal or we need to change your treatment, we will call you to review the results.   Testing/Procedures:  Your  physician has recommended that you wear a Zio XT monitor 2 weeks.   This monitor is a medical device that records the heart's electrical activity. Doctors most often use these monitors to diagnose arrhythmias. Arrhythmias are problems with the speed or rhythm of the heartbeat. The monitor is a small device applied to your chest. You can wear one while you do your normal daily activities. While wearing this monitor if you have any symptoms to push the button and record what you felt. Once you have worn this monitor for the period of time provider prescribed (Usually 14 days), you will return the monitor device in the postage paid box. Once it is returned they will download the data collected and provide [place] with a report which the provider will then review and we will call you with those results. Important tips:  Avoid showering during the first 24 hours of wearing the monitor. Avoid excessive sweating to help maximize wear time. Do not submerge the device, no hot tubs, and no swimming pools. Keep any lotions or oils away from the patch. After 24 hours you may shower with the patch on. Take brief showers with your back facing the shower head.  Do not remove patch once it has been placed because that will interrupt data and decrease adhesive wear time. Push  the button when you have any symptoms and write down what you were feeling. Once you have completed wearing your monitor, remove and place into box which has postage paid and place in your outgoing mailbox.  If for some reason you have misplaced your box then call our office and we can provide another box and/or mail it off for you.      Follow-Up: At [facility], you and your health needs are our priority.  As part of our continuing mission to provide you with exceptional heart care, we have created designated Provider Care Teams.  These Care Teams include your primary Cardiologist (physician) and Advanced Practice Providers (APPs -  Physician Assistants and Nurse Practitioners) who all work together to provide you with the care you need, when you need it.  We recommend signing up for the patient portal called "MyChart".  Sign up information is provided on this After Visit Summary.  MyChart is used to connect with patients for Virtual Visits (Telemedicine).  Patients are able to view lab/test results, encounter notes, upcoming appointments, etc.  Non-urgent messages can be sent to your provider as well.   To learn more about what you can do with MyChart, go to [email or web address].    Your next appointment:   6 week(s)  The format for your next appointment:   In Person  Provider:   You may see Dr. Garen Lah or one of the following Advanced Practice Providers on your designated Care Team:   Murray Hodgkins, NP Christell Faith, PA-C Marrianne Mood, PA-C Cadence Mena, [place]   Other Instructions     Signed, Kate Sable, MD  01/24/2021 4:53 PM    [place]

## 2021-01-24 NOTE — Patient Instructions (Signed)
Medication Instructions:   Your physician recommends that you continue on your current medications as directed. Please refer to the Current Medication list given to you today.   *If you need a refill on your cardiac medications before your next appointment, please call your pharmacy*   Lab Work: None ordered  If you have labs (blood work) drawn today and your tests are completely normal, you will receive your results only by: [place] (if you have MyChart) OR A paper copy in the mail If you have any lab test that is abnormal or we need to change your treatment, we will call you to review the results.   Testing/Procedures:  Your physician has recommended that you wear a Zio XT monitor 2 weeks.   This monitor is a medical device that records the heart's electrical activity. Doctors most often use these monitors to diagnose arrhythmias. Arrhythmias are problems with the speed or rhythm of the heartbeat. The monitor is a small device applied to your chest. You can wear one while you do your normal daily activities. While wearing this monitor if you have any symptoms to push the button and record what you felt. Once you have worn this monitor for the period of time provider prescribed (Usually 14 days), you will return the monitor device in the postage paid box. Once it is returned they will download the data collected and provide [place] with a report which the provider will then review and we will call you with those results. Important tips:  Avoid showering during the first 24 hours of wearing the monitor. Avoid excessive sweating to help maximize wear time. Do not submerge the device, no hot tubs, and no swimming pools. Keep any lotions or oils away from the patch. After 24 hours you may shower with the patch on. Take brief showers with your back facing the shower head.  Do not remove patch once it has been placed because that will interrupt data and decrease adhesive wear time. Push the  button when you have any symptoms and write down what you were feeling. Once you have completed wearing your monitor, remove and place into box which has postage paid and place in your outgoing mailbox.  If for some reason you have misplaced your box then call our office and we can provide another box and/or mail it off for you.      Follow-Up: At [facility], you and your health needs are our priority.  As part of our continuing mission to provide you with exceptional heart care, we have created designated Provider Care Teams.  These Care Teams include your primary Cardiologist (physician) and Advanced Practice Providers (APPs -  Physician Assistants and Nurse Practitioners) who all work together to provide you with the care you need, when you need it.  We recommend signing up for the patient portal called "MyChart".  Sign up information is provided on this After Visit Summary.  MyChart is used to connect with patients for Virtual Visits (Telemedicine).  Patients are able to view lab/test results, encounter notes, upcoming appointments, etc.  Non-urgent messages can be sent to your provider as well.   To learn more about what you can do with MyChart, go to [email or web address].    Your next appointment:   6 week(s)  The format for your next appointment:   In Person  Provider:   You may see Dr. Garen Lah or one of the following Advanced Practice Providers on your designated Care Team:   Murray Hodgkins,  NP Christell Faith, PA-C Marrianne Mood, PA-C Cadence Kathlen Mody, [place]   Other Instructions

## 2021-01-29 DIAGNOSIS — J301 Allergic rhinitis due to pollen: Secondary | ICD-10-CM | POA: Diagnosis not present

## 2021-01-29 DIAGNOSIS — G4733 Obstructive sleep apnea (adult) (pediatric): Secondary | ICD-10-CM | POA: Diagnosis not present

## 2021-01-31 ENCOUNTER — Other Ambulatory Visit: Payer: Self-pay | Admitting: Family Medicine

## 2021-01-31 DIAGNOSIS — E039 Hypothyroidism, unspecified: Secondary | ICD-10-CM

## 2021-02-01 NOTE — Telephone Encounter (Signed)
Requested medications are due for refill today NO requesting early  Requested medications are on the active medication list yes  Last refill 12/26/20 for 90 days  Last visit 12/26/20  Future visit scheduled no  Notes to clinic requesting early, please assess.  Requested Prescriptions  Pending Prescriptions Disp Refills   SYNTHROID 88 MCG tablet [Pharmacy Med Name: SYNTHROID 88 MCG TAB] 90 tablet 0    Sig: TAKE 1 TABLET BY MOUTH ONCE DAILY ON AN EMPTY STOMACH. WAIT 30 MINUTES BEFORE TAKING OTHER MEDS.     Endocrinology:  Hypothyroid Agents Failed - 01/31/2021  4:44 PM      Failed - TSH needs to be rechecked within 3 months after an abnormal result. Refill until TSH is due.      Passed - TSH in normal range and within 360 days    TSH  Date Value Ref Range Status  12/24/2020 2.770 0.450 - 4.500 uIU/mL Final          Passed - Valid encounter within last 12 months    Recent Outpatient Visits           1 month ago Palpitations   [place], MD       Future Appointments             In 1 month [place], Aaron Edelman, MD [id number]

## 2021-02-08 DIAGNOSIS — G4730 Sleep apnea, unspecified: Secondary | ICD-10-CM | POA: Diagnosis not present

## 2021-02-18 DIAGNOSIS — R002 Palpitations: Secondary | ICD-10-CM | POA: Diagnosis not present

## 2021-02-25 DIAGNOSIS — E782 Mixed hyperlipidemia: Secondary | ICD-10-CM | POA: Insufficient documentation

## 2021-02-25 DIAGNOSIS — E559 Vitamin D deficiency, unspecified: Secondary | ICD-10-CM | POA: Insufficient documentation

## 2021-03-04 ENCOUNTER — Telehealth: Payer: Self-pay

## 2021-03-04 NOTE — Telephone Encounter (Signed)
The patient has been notified of the result and verbalized understanding.  All questions (if any) were answered. Kavin Leech, RN 03/04/2021 1:46 PM

## 2021-03-04 NOTE — Telephone Encounter (Signed)
Called patient and  was unable to leave a VM X 1.

## 2021-03-04 NOTE — Telephone Encounter (Signed)
-----   Message from Kate Sable, MD sent at 02/27/2021 12:27 PM EST ----- Occasional paroxysmal SVT and frequent PVCs noted.  Patient triggered events were associated with PVCs.  Keep follow-up appointment, may consider beta-blocker.

## 2021-03-10 ENCOUNTER — Ambulatory Visit: Payer: BC Managed Care – PPO | Admitting: Cardiology

## 2021-03-14 ENCOUNTER — Encounter: Payer: Self-pay | Admitting: Nurse Practitioner

## 2021-03-14 ENCOUNTER — Ambulatory Visit: Payer: BC Managed Care – PPO | Admitting: Nurse Practitioner

## 2021-03-14 ENCOUNTER — Other Ambulatory Visit: Payer: Self-pay

## 2021-03-14 VITALS — BP 110/80 | HR 77 | Ht 67.0 in | Wt 272.0 lb

## 2021-03-14 DIAGNOSIS — G4733 Obstructive sleep apnea (adult) (pediatric): Secondary | ICD-10-CM

## 2021-03-14 DIAGNOSIS — E782 Mixed hyperlipidemia: Secondary | ICD-10-CM | POA: Diagnosis not present

## 2021-03-14 DIAGNOSIS — R002 Palpitations: Secondary | ICD-10-CM

## 2021-03-14 DIAGNOSIS — R072 Precordial pain: Secondary | ICD-10-CM | POA: Diagnosis not present

## 2021-03-14 DIAGNOSIS — I493 Ventricular premature depolarization: Secondary | ICD-10-CM

## 2021-03-14 MED ORDER — METOPROLOL TARTRATE 100 MG PO TABS: 100.0000 mg | ORAL_TABLET | Freq: Once | ORAL | 0 refills | Status: DC

## 2021-03-14 MED ORDER — METOPROLOL TARTRATE 25 MG PO TABS: 12.5000 mg | ORAL_TABLET | Freq: Two times a day (BID) | ORAL | 3 refills | Status: DC | PRN

## 2021-03-14 NOTE — Patient Instructions (Addendum)
Medication Instructions:  Your physician has recommended you make the following change in your medication:   START Metoprolol tartrate 12.5 mg twice a day as needed for palpitations.  *If you need a refill on your cardiac medications before your next appointment, please call your pharmacy*   Lab Work: BMET today  If you have labs (blood work) drawn today and your tests are completely normal, you will receive your results only by: [place] (if you have MyChart) OR A paper copy in the mail If you have any lab test that is abnormal or we need to change your treatment, we will call you to review the results.   Testing/Procedures:   Your cardiac CT is scheduled at the below location:    [facility] [place], [place] [address] [phone]  This test is scheduled for 03/27/21 at 10:00 am with arrival time of 09:45 am.  Please arrive 15 mins early for check-in and test prep.  Please follow these instructions carefully (unless otherwise directed):  On the Night Before the Test: Be sure to Drink plenty of water. Do not consume any caffeinated/decaffeinated beverages or chocolate 12 hours prior to your test. Do not take any antihistamines (Zyrtec) 12 hours prior to your test.  On the Day of the Test: Drink plenty of water until 1 hour prior to the test. Do not eat any food 4 hours prior to the test. You may take your regular medications prior to the test.  Take metoprolol (Lopressor) 100 mg two hours prior to test. HOLD Furosemide/Hydrochlorothiazide morning of the test. FEMALES- please wear underwire-free bra if available, avoid dresses & tight clothing       After the Test: Drink plenty of water. After receiving IV contrast, you may experience a mild flushed feeling. This is normal. On occasion, you may experience a mild rash up to 24 hours after the test. This is not dangerous. If this occurs, you can take  Benadryl 25 mg and increase your fluid intake. If you experience trouble breathing, this can be serious. If it is severe call 911 IMMEDIATELY. If it is mild, please call our office. If you take any of these medications: Glipizide/Metformin, Avandament, Glucavance, please do not take 48 hours after completing test unless otherwise instructed.  Please allow 2-4 weeks for scheduling of routine cardiac CTs. Some insurance companies require a pre-authorization which may delay scheduling of this test.   For non-scheduling related questions, please contact the cardiac imaging nurse navigator should you have any questions/concerns: Marchia Bond, Cardiac Imaging Nurse Navigator Gordy Clement, Cardiac Imaging Nurse Navigator [facility] Direct Office Dial: [phone]   For scheduling needs, including cancellations and rescheduling, please call [place], [phone].    Follow-Up: At [facility], you and your health needs are our priority.  As part of our continuing mission to provide you with exceptional heart care, we have created designated Provider Care Teams.  These Care Teams include your primary Cardiologist (physician) and Advanced Practice Providers (APPs -  Physician Assistants and Nurse Practitioners) who all work together to provide you with the care you need, when you need it.   Your next appointment:   6 week(s)  The format for your next appointment:   In Person  Provider:   Kate Sable, MD or Murray Hodgkins, NP

## 2021-03-14 NOTE — Progress Notes (Signed)
Office Visit    Patient Name: Holly Johnson Date of Encounter: 03/14/2021  Primary Care Provider:  Montel Culver, MD Primary Cardiologist:  Kate Sable, MD  Chief Complaint    45 year old female with a history of anxiety, obesity and palpitations, who presents for follow-up after recent event monitoring showed symptomatic PVCs and chest pain.  Past Medical History    Past Medical History:  Diagnosis Date   Allergy    Anxiety    Basal cell carcinoma 2020   basal cell carcinoma   Biliary dyskinesia 07/25/2015   Chest pain    Morbid obesity (HCC)    OSA (obstructive sleep apnea)    Palpitations    PSVT (paroxysmal supraventricular tachycardia) ([place])    a. 12/2020 Zio: Occas PSVT - fastest 162 x 7 beats, longest 9 beats @ 111.   Symptomatic PVC's (premature ventricular contractions)    a. 12/2020 Zio: 5.3% PVC burden. Triggered events assoc w/ PVCs.   Past Surgical History:  Procedure Laterality Date   BREAST BIOPSY Right 2017   fibroadenoma   CHOLECYSTECTOMY  2018   MOHS SURGERY  2020   x 3    Allergies  No Known Allergies  History of Present Illness    45 year old female with a prior history of anxiety, obesity and palpitations.  She was recently seen on October 28 with a 4 to 61-monthhistory of intermittent palpitations without associated symptoms.  She reported that time a prior history of occasional PVCs which were noted on a smart watch.  A Zio monitor was placed and showed predominantly sinus rhythm with occasional brief runs of PSVT-the longest lasting 9 beats and the fastest up to 162 bpm.  Showed frequent PVCs with a 5.3% burden.  Triggered events were associated with PVCs.  Since her last visit, she has continued to note occasional PVCs.  She seems to have good days and bad days.  She has not had any sustained palpitations or presyncope.  Today, she also reports a several month history of intermittent rest and exertional substernal chest  squeezing and tightness that occurs at least once a week, last anywhere between 5 and 30 minutes, and resolves with rest or relaxation.  She had an episode this past weekend [facility] [place] [facility] [facility] associated with dyspnea, that resolved when she was able to get the level ground and slow down.  She denies PND, orthopnea, dizziness, syncope, edema, or early satiety.  Since her last visit, she has also had a sleep study and was found to have moderate obstructive sleep apnea.  She is currently awaiting CPAP.  Home Medications    Current Outpatient Medications  Medication Sig Dispense Refill   cetirizine (ZYRTEC) 10 MG tablet Take 10 mg by mouth daily.     FLUoxetine (PROZAC) 10 MG tablet Take 1 tablet (10 mg total) by mouth daily. 90 tablet 0   levonorgestrel (MIRENA) 20 MCG/24HR IUD 1 each by Intrauterine route once.     SYNTHROID 88 MCG tablet Take 1 tablet (88 mcg total) by mouth daily before breakfast. 90 tablet 0   VITAMIN D PO Take 2,000 Units by mouth daily.     No current facility-administered medications for this visit.     Review of Systems    She continues to have intermittent palpitations as previously described.  She also reports intermittent rest and exertional substernal chest squeezing and tightness sometimes associated with dyspnea, lasting between 5 and 30 minutes, and  resolving with rest/relaxation.  She denies PND, orthopnea, dizziness, syncope, edema, or early satiety all other systems reviewed and are otherwise negative except as noted above.    Physical Exam    VS:  BP 110/80 (BP Location: Left Arm, Patient Position: Sitting, Cuff Size: Large)    Pulse 77    Ht 5' 7"  (1.702 m)    Wt 272 lb (123.4 kg)    SpO2 98%    BMI 42.60 kg/m  , BMI Body mass index is 42.6 kg/m.     GEN: Obese, in no acute distress. HEENT: normal. Neck: Supple, no JVD, carotid bruits, or masses. Cardiac: RRR, no murmurs, rubs, or gallops. No clubbing, cyanosis,  edema.  Radials/PT 2+ and equal bilaterally.  Respiratory:  Respirations regular and unlabored, clear to auscultation bilaterally. GI: Soft, nontender, nondistended, BS + x 4. MS: no deformity or atrophy. Skin: warm and dry, no rash. Neuro:  Strength and sensation are intact. Psych: Normal affect.  Accessory Clinical Findings    ECG personally reviewed by me today -regular sinus rhythm, 77 nonspecific T changes- no acute changes.  Lab Results  Component Value Date   WBC 6.8 12/24/2020   HGB 14.9 12/24/2020   HCT 44.2 12/24/2020   MCV 88 12/24/2020   PLT 281 12/24/2020   Lab Results  Component Value Date   CREATININE 0.88 12/24/2020   BUN 10 12/24/2020   NA 138 12/24/2020   K 4.5 12/24/2020   CL 102 12/24/2020   CO2 23 12/24/2020   Lab Results  Component Value Date   ALT 64 (H) 12/24/2020   AST 46 (H) 12/24/2020   ALKPHOS 68 12/24/2020   BILITOT 0.6 12/24/2020   Lab Results  Component Value Date   CHOL 209 (H) 12/24/2020   HDL 40 12/24/2020   LDLCALC 143 (H) 12/24/2020   TRIG 146 12/24/2020   CHOLHDL 5.2 (H) 12/24/2020     Assessment & Plan    1.  Precordial chest pain: Patient with a history of palpitations and recently documented PVCs, who today notes a several month history of intermittent rest and exertional substernal chest squeezing and tightness associated with dyspnea, lasting 5 to 30 minutes, resolving with rest/relaxation.  She did not report this at prior visit.  She had an episode this weekend while walking [place] an incline of the [facility] that eventually resolved with rest.  In the setting of PVCs, obesity, elevated lipids, and chest pain, we agreed to pursue a coronary CT angiogram to rule out obstructive coronary artery disease.  We will follow-up basic metabolic panel today.  2.  Palpitations/symptomatic PVCs/PSVT: Patient with a history of PVCs and palpitations recently wore an event monitor which showed a 5.3% PVC burden.  She also had brief  runs of PSVT, though these are asymptomatic and appear to mostly occur at night.  Triggered events were associated PVCs.  We discussed possible medical management options and she is interested in starting a low-dose beta-blocker on an as-needed basis, considering that she has good and bad days.  I will prescribe metoprolol 12.5 mg twice daily as needed for palpitations.  In light of rest and exertional chest pain, obtaining coronary CTA as outlined above.  3.  Hyperlipidemia: Total cholesterol 209 with an LDL of 143 in September.  Will await coronary CTA.  Any evidence of calcium or atherosclerosis, and I would have a low threshold to initiate statin therapy.  4.  Obstructive sleep apnea: Recently diagnosed.  Awaiting CPAP.  5.  Disposition: Arranging coronary CTA.  Follow-up in approximately 6 weeks.   Murray Hodgkins, NP 03/14/2021, 8:37 AM

## 2021-03-15 LAB — BASIC METABOLIC PANEL
BUN/Creatinine Ratio: 20 (ref 9–23)
BUN: 16 mg/dL (ref 6–24)
CO2: 23 mmol/L (ref 20–29)
Calcium: 9.1 mg/dL (ref 8.7–10.2)
Chloride: 106 mmol/L (ref 96–106)
Creatinine, Ser: 0.8 mg/dL (ref 0.57–1.00)
Glucose: 101 mg/dL — ABNORMAL HIGH (ref 70–99)
Potassium: 4.3 mmol/L (ref 3.5–5.2)
Sodium: 141 mmol/L (ref 134–144)
eGFR: 93 mL/min/{1.73_m2} (ref >59)

## 2021-03-26 ENCOUNTER — Telehealth (HOSPITAL_COMMUNITY): Payer: Self-pay | Admitting: *Deleted

## 2021-03-26 NOTE — Telephone Encounter (Signed)
Reaching out to patient to offer assistance regarding upcoming cardiac imaging study; pt verbalizes understanding of appt date/time, parking situation and where to check in, pre-test NPO status and medications ordered, and verified current allergies; name and call back number provided for further questions should they arise  Gordy Clement RN Navigator Cardiac [place] [facility] [phone] office [phone] cell   Patient to take 137m metoprolol tartrate two hours prior to cardiac CT scan.

## 2021-03-27 ENCOUNTER — Ambulatory Visit
Admission: RE | Admit: 2021-03-27 | Discharge: 2021-03-27 | Disposition: A | Discharge status: Home / Self Care | Payer: BC Managed Care – PPO | Source: Ambulatory Visit | Attending: Nurse Practitioner | Admitting: Nurse Practitioner

## 2021-03-27 ENCOUNTER — Other Ambulatory Visit: Payer: Self-pay

## 2021-03-27 DIAGNOSIS — R072 Precordial pain: Secondary | ICD-10-CM | POA: Diagnosis not present

## 2021-03-27 MED ORDER — IOHEXOL 350 MG/ML SOLN
100.0000 mL | Freq: Once | INTRAVENOUS | Status: AC | PRN
  Administered 2021-03-27: 10:00:00 100 mL via INTRAVENOUS

## 2021-03-27 MED ORDER — NITROGLYCERIN 0.4 MG SL SUBL
0.8000 mg | SUBLINGUAL_TABLET | Freq: Once | SUBLINGUAL | Status: AC
  Administered 2021-03-27: 10:00:00 0.8 mg via SUBLINGUAL

## 2021-03-27 NOTE — Progress Notes (Signed)
Patient tolerated procedure well. Ambulate w/o difficulty. Denies light headedness or being dizzy. Sitting in chair drinking water provided. Encouraged to drink extra water today and reasoning explained. Verbalized understanding. All questions answered. ABC intact. No further needs. Discharge from procedure area w/o issues.

## 2021-04-15 ENCOUNTER — Other Ambulatory Visit: Payer: Self-pay | Admitting: Family Medicine

## 2021-04-15 DIAGNOSIS — E039 Hypothyroidism, unspecified: Secondary | ICD-10-CM

## 2021-04-15 DIAGNOSIS — F419 Anxiety disorder, unspecified: Secondary | ICD-10-CM

## 2021-04-15 NOTE — Telephone Encounter (Signed)
Requested Prescriptions  Pending Prescriptions Disp Refills   SYNTHROID 88 MCG tablet [Pharmacy Med Name: SYNTHROID 88 MCG TAB] 90 tablet 0    Sig: TAKE 1 TABLET BY MOUTH ONCE DAILY ON AN EMPTY STOMACH. WAIT 30 MINUTES BEFORE TAKING OTHER MEDS.     Endocrinology:  Hypothyroid Agents Failed - 04/15/2021  5:58 PM      Failed - TSH needs to be rechecked within 3 months after an abnormal result. Refill until TSH is due.      Passed - TSH in normal range and within 360 days    TSH  Date Value Ref Range Status  12/24/2020 2.770 0.450 - 4.500 uIU/mL Final         Passed - Valid encounter within last 12 months    Recent Outpatient Visits          3 months ago Palpitations   [place], MD      Future Appointments            In 2 weeks Sharolyn Douglas, Clance Boll, NP [place]            FLUoxetine (PROZAC) 10 MG capsule [Pharmacy Med Name: FLUOXETINE HCL 10 MG CAP] 90 capsule     Sig: TAKE 1 CAPSULE BY MOUTH ONCE DAILY     Psychiatry:  Antidepressants - SSRI Passed - 04/15/2021  5:58 PM      Passed - Valid encounter within last 6 months    Recent Outpatient Visits          3 months ago Palpitations   [facility] Montel Culver, MD      Future Appointments            In 2 weeks Sharolyn Douglas, Clance Boll, NP [facility], [place]

## 2021-04-23 DIAGNOSIS — G4733 Obstructive sleep apnea (adult) (pediatric): Secondary | ICD-10-CM | POA: Diagnosis not present

## 2021-04-29 ENCOUNTER — Telehealth: Payer: Self-pay

## 2021-04-29 ENCOUNTER — Ambulatory Visit: Payer: BC Managed Care – PPO | Admitting: Nurse Practitioner

## 2021-04-29 ENCOUNTER — Other Ambulatory Visit: Payer: Self-pay

## 2021-04-29 ENCOUNTER — Encounter: Payer: Self-pay | Admitting: Nurse Practitioner

## 2021-04-29 VITALS — BP 110/78 | HR 70 | Ht 66.0 in | Wt 271.0 lb

## 2021-04-29 DIAGNOSIS — E782 Mixed hyperlipidemia: Secondary | ICD-10-CM

## 2021-04-29 DIAGNOSIS — R002 Palpitations: Secondary | ICD-10-CM

## 2021-04-29 DIAGNOSIS — G4733 Obstructive sleep apnea (adult) (pediatric): Secondary | ICD-10-CM

## 2021-04-29 DIAGNOSIS — R072 Precordial pain: Secondary | ICD-10-CM | POA: Diagnosis not present

## 2021-04-29 DIAGNOSIS — I493 Ventricular premature depolarization: Secondary | ICD-10-CM

## 2021-04-29 NOTE — Patient Instructions (Signed)
Medication Instructions:  No changes at this time.   *If you need a refill on your cardiac medications before your next appointment, please call your pharmacy*   Lab Work: None  If you have labs (blood work) drawn today and your tests are completely normal, you will receive your results only by: [place] (if you have MyChart) OR A paper copy in the mail If you have any lab test that is abnormal or we need to change your treatment, we will call you to review the results.   Testing/Procedures: None   Follow-Up: At [facility], you and your health needs are our priority.  As part of our continuing mission to provide you with exceptional heart care, we have created designated Provider Care Teams.  These Care Teams include your primary Cardiologist (physician) and Advanced Practice Providers (APPs -  Physician Assistants and Nurse Practitioners) who all work together to provide you with the care you need, when you need it.   Your next appointment:   1 year(s)  The format for your next appointment:   In Person  Provider:   Kate Sable, MD or Murray Hodgkins, NP

## 2021-04-29 NOTE — Telephone Encounter (Signed)
Please schedule at patient's convenience.

## 2021-04-29 NOTE — Telephone Encounter (Signed)
-----   Message from Montel Culver, MD sent at 04/29/2021  4:55 PM EST ----- Regarding: Needs follow-up visit scheduled Needs follow-up visit scheduled

## 2021-04-29 NOTE — Progress Notes (Signed)
Office Visit    Patient Name: Holly Johnson Date of Encounter: 04/29/2021  Primary Care Provider:  Montel Culver, MD Primary Cardiologist:  Kate Sable, MD  Chief Complaint    46 year old female with history of anxiety, obesity, palpitations, and chest pain, who presents for follow-up after recent coronary CTA showing normal coronary arteries.  Past Medical History    Past Medical History:  Diagnosis Date   Allergy    Anxiety    Basal cell carcinoma 2020   basal cell carcinoma   Biliary dyskinesia 07/25/2015   Chest pain    a. 02/2021 Cor CTA: Cor Ca2+ = 0. Nl cors. No significant noncardiac findings.   Morbid obesity (HCC)    OSA (obstructive sleep apnea)    Palpitations    PSVT (paroxysmal supraventricular tachycardia) ([place])    a. 12/2020 Zio: Occas PSVT - fastest 162 x 7 beats, longest 9 beats @ 111.   Symptomatic PVC's (premature ventricular contractions)    a. 12/2020 Zio: 5.3% PVC burden. Triggered events assoc w/ PVCs.   Past Surgical History:  Procedure Laterality Date   BREAST BIOPSY Right 2017   fibroadenoma   CHOLECYSTECTOMY  2018   MOHS SURGERY  2020   x 3    Allergies  No Known Allergies  History of Present Illness    46 year old female with the above past medical history including anxiety, obesity, palpitations, and chest pain.  She was seen in October 2022 with a 4 to 69-monthhistory of intermittent palpitations without associated symptoms.  Zio monitoring showed predominantly sinus rhythm with occasional brief runs of PSVT-the longest lasting 9 beats and the fastest at 160 bpm.  She also was noted to have frequent PVCs with a 5.3% burden.  Triggered events were associated with PVCs.  I follow-up on March 14, 2021, as reported ongoing intermittent palpitations as well as a several month history of intermittent rest and exertional chest tightness and squeezing occurring at least once a week and lasting between 5 to 30 minutes.  She  subsequently underwent coronary CT angiography on December 29 revealing normal coronary arteries and a calcium score of 0.  Since her last visit, she has been feeling well.  She has not really been having any chest pain.  She notes that PVCs seem to be more likely during periods of stress and by controlling this a little bit better, she thinks symptoms have improved some.  She has not required any as needed metoprolol up to this point, though she did consider around the Christmas holiday when feeling stressed while her husband was driving on icy roads.  She has been able to add back a little bit of caffeine which has not increased her burden of palpitations.  She is now using CPAP and is hopeful that with ongoing therapy, overall burden of palpitations will improve.  Home Medications    Current Outpatient Medications  Medication Sig Dispense Refill   cetirizine (ZYRTEC) 10 MG tablet Take 10 mg by mouth daily.     FLUoxetine (PROZAC) 10 MG capsule TAKE 1 CAPSULE BY MOUTH ONCE DAILY 90 capsule 0   levonorgestrel (MIRENA) 20 MCG/24HR IUD 1 each by Intrauterine route once.     metoprolol tartrate (LOPRESSOR) 25 MG tablet Take 0.5 tablets (12.5 mg total) by mouth 2 (two) times daily as needed (As needed for palpitations). 45 tablet 3   SYNTHROID 88 MCG tablet TAKE 1 TABLET BY MOUTH ONCE DAILY ON AN EMPTY STOMACH. WAIT 30 MINUTES  BEFORE TAKING OTHER MEDS. 90 tablet 1   VITAMIN D PO Take 2,000 Units by mouth daily.     No current facility-administered medications for this visit.     Review of Systems    She denies chest pain, palpitations, dyspnea, pnd, orthopnea, n, v, dizziness, syncope, edema, weight gain, or early satiety.  All other systems reviewed and are otherwise negative except as noted above.    Physical Exam    VS:  BP 110/78 (BP Location: Left Arm, Patient Position: Sitting, Cuff Size: Large)    Pulse 70    Ht 5' 6"  (1.676 m)    Wt 271 lb (122.9 kg)    SpO2 98%    BMI 43.74 kg/m  ,  BMI Body mass index is 43.74 kg/m.     GEN: obese, in no acute distress. HEENT: normal. Neck: Supple, obese, difficult to gauge JVP.  No carotid bruits, or masses. Cardiac: RRR, no murmurs, rubs, or gallops. No clubbing, cyanosis, edema.  Radials/DP/PT 2+ and equal bilaterally.  Respiratory:  Respirations regular and unlabored, clear to auscultation bilaterally. GI: Soft, nontender, nondistended, BS + x 4. MS: no deformity or atrophy. Skin: warm and dry, no rash. Neuro:  Strength and sensation are intact. Psych: Normal affect.  Accessory Clinical Findings     Lab Results  Component Value Date   WBC 6.8 12/24/2020   HGB 14.9 12/24/2020   HCT 44.2 12/24/2020   MCV 88 12/24/2020   PLT 281 12/24/2020   Lab Results  Component Value Date   CREATININE 0.80 03/14/2021   BUN 16 03/14/2021   NA 141 03/14/2021   K 4.3 03/14/2021   CL 106 03/14/2021   CO2 23 03/14/2021   Lab Results  Component Value Date   ALT 64 (H) 12/24/2020   AST 46 (H) 12/24/2020   ALKPHOS 68 12/24/2020   BILITOT 0.6 12/24/2020   Lab Results  Component Value Date   CHOL 209 (H) 12/24/2020   HDL 40 12/24/2020   LDLCALC 143 (H) 12/24/2020   TRIG 146 12/24/2020   CHOLHDL 5.2 (H) 12/24/2020    Assessment & Plan    1.  Precordial chest pain: At office visit on December 16, patient reported intermittent rest and exertional chest squeezing and tightness associated with dyspnea.  She was also having dyspnea on exertion.  Coronary CT angiogram showed normal coronary arteries with a calcium score of 0.  Discussed today.  Patient feels very much reassured by findings, and has not been experiencing chest pain recently.  No further cardiac evaluation is warranted.  2.  Palpitations/symptomatic PVCs/PSVT: Overall stable since last visit.  Patient has not required any as needed beta-blocker.  She feels that she has been able to identify that symptoms are little more likely to occur during periods of stress, and she has  been working on that.  3.  Hyperlipidemia: Total cholesterol 209 with an LDL of 143 in September 2022.  Coronary calcium of 0 and no evidence of coronary artery disease.  Recommend lifestyle modifications.  4.  Obstructive sleep apnea: Has finally obtained CPAP and just started wearing.  5.  Disposition: Follow-up in clinic in 1 year or sooner if necessary.   Murray Hodgkins, NP 04/29/2021, 9:18 AM

## 2021-04-30 NOTE — Telephone Encounter (Signed)
For your information  

## 2021-05-06 ENCOUNTER — Encounter: Payer: Self-pay | Admitting: Family Medicine

## 2021-05-06 ENCOUNTER — Ambulatory Visit: Payer: BC Managed Care – PPO | Admitting: Family Medicine

## 2021-05-06 ENCOUNTER — Other Ambulatory Visit: Payer: Self-pay

## 2021-05-06 VITALS — BP 110/82 | HR 67 | Ht 66.0 in | Wt 274.0 lb

## 2021-05-06 DIAGNOSIS — R002 Palpitations: Secondary | ICD-10-CM

## 2021-05-06 DIAGNOSIS — F419 Anxiety disorder, unspecified: Secondary | ICD-10-CM | POA: Diagnosis not present

## 2021-05-06 DIAGNOSIS — Z1211 Encounter for screening for malignant neoplasm of colon: Secondary | ICD-10-CM | POA: Diagnosis not present

## 2021-05-06 DIAGNOSIS — Z30431 Encounter for routine checking of intrauterine contraceptive device: Secondary | ICD-10-CM | POA: Diagnosis not present

## 2021-05-06 DIAGNOSIS — G4733 Obstructive sleep apnea (adult) (pediatric): Secondary | ICD-10-CM

## 2021-05-06 DIAGNOSIS — Z9989 Dependence on other enabling machines and devices: Secondary | ICD-10-CM

## 2021-05-06 NOTE — Assessment & Plan Note (Addendum)
Well-controlled on current regimen, is interested in cognitive behavioral therapy as an adjunct, she will seek this out on her own.  Her PHQ and GAD scores were excellent today.  As as she has noted improvement in her anxiety control, previously noted comorbid palpitations have resolved.  She can contact us if needing a referral to therapist.  She is doing extremely well and we can follow-up on this issue on an as-needed basis.  Chronic condition, stable

## 2021-05-06 NOTE — Assessment & Plan Note (Signed)
Cologuard ordered today

## 2021-05-06 NOTE — Progress Notes (Signed)
°  ° °  Primary Care / Sports Medicine Office Visit  Patient Information:  Patient ID: Holly Johnson, female DOB: 15-Jun-1975 Age: 46 y.o. MRN: [id number]   Holly Johnson is a pleasant 46 y.o. female presenting with the following:  Chief Complaint  Patient presents with   Palpitations    Following with cardiology; stable, has metoprolol to take if needed, but has never taken it   Anxiety    Stable on fluoxetine    Vitals:   05/06/21 0932  BP: 110/82  Pulse: 67  SpO2: 96%   Vitals:   05/06/21 0932  Weight: 274 lb (124.3 kg)  Height: 5' 6"  (1.676 m)   Body mass index is 44.22 kg/m.  No results found.   Independent interpretation of notes and tests performed by another provider:   None  Procedures performed:   None  Pertinent History, Exam, Impression, and Recommendations:   Colon cancer screening Cologuard ordered today  Anxiety Well-controlled on current regimen, is interested in cognitive behavioral therapy as an adjunct, she will seek this out on her own.  Her PHQ and GAD scores were excellent today.  As as she has noted improvement in her anxiety control, previously noted comorbid palpitations have resolved.  She can contact us if needing a referral to therapist.  She is doing extremely well and we can follow-up on this issue on an as-needed basis.  Chronic condition, stable  Palpitations Has recently had work-up through cardiology including Holter monitor and cardiac CT, findings benign per patient.  Review of monitor report shows noted occasional paroxysmal SVTs and frequent PVCs. Cardiac CT results with coronary calcium score of 0 and no evidence of CAD. Additionally, she has noted that with decreased life stressors her symptoms have been essentially absent.  She was prescribed metoprolol to be used on an as needed basis and has not required this as she finds that her anxiety symptoms have been better.  At this stage have advised her to continue to focus  on anxiety control as she has been, and to continue to utilize metoprolol per cardiology on an as-needed basis (which she has not needed thus far).  Reviewed outside cardiology visit note from 04/29/2021, cardiac CT report from 03/27/2021, outside cardiology visit notes from 03/14/2021 and 01/24/2021 as part of the care for this patient  Chronic condition, stable  OSA on CPAP Did have recent sleep study confirming OSA, recommended use of CPAP.  She is tolerating nasal cushion somewhat well, does plan to touch base with sleep medicine for better fit options.  Otherwise, feeling increased energy levels, doing fine otherwise.   Orders & Medications No orders of the defined types were placed in this encounter.  Orders Placed This Encounter  Procedures   Cologuard   Ambulatory referral to Gynecology     Return in about 4 weeks (around 06/03/2021) for Annual physical.     Montel Culver, MD   [place]

## 2021-05-06 NOTE — Assessment & Plan Note (Signed)
Did have recent sleep study confirming OSA, recommended use of CPAP.  She is tolerating nasal cushion somewhat well, does plan to touch base with sleep medicine for better fit options.  Otherwise, feeling increased energy levels, doing fine otherwise.

## 2021-05-06 NOTE — Assessment & Plan Note (Addendum)
Has recently had work-up through cardiology including Holter monitor and cardiac CT, findings benign per patient.  Review of monitor report shows noted occasional paroxysmal SVTs and frequent PVCs. Cardiac CT results with coronary calcium score of 0 and no evidence of CAD. Additionally, she has noted that with decreased life stressors her symptoms have been essentially absent.  She was prescribed metoprolol to be used on an as needed basis and has not required this as she finds that her anxiety symptoms have been better.  At this stage have advised her to continue to focus on anxiety control as she has been, and to continue to utilize metoprolol per cardiology on an as-needed basis (which she has not needed thus far).  Reviewed outside cardiology visit note from 04/29/2021, cardiac CT report from 03/27/2021, outside cardiology visit notes from 03/14/2021 and 01/24/2021 as part of the care for this patient  Chronic condition, stable

## 2021-05-20 ENCOUNTER — Ambulatory Visit: Payer: BC Managed Care – PPO | Admitting: Certified Nurse Midwife

## 2021-05-20 ENCOUNTER — Other Ambulatory Visit: Payer: Self-pay

## 2021-05-20 VITALS — BP 148/82 | HR 73 | Ht 67.0 in | Wt 275.4 lb

## 2021-05-20 DIAGNOSIS — Z30433 Encounter for removal and reinsertion of intrauterine contraceptive device: Secondary | ICD-10-CM

## 2021-05-20 NOTE — Progress Notes (Signed)
°  GYNECOLOGY OFFICE PROCEDURE NOTE  Holly Johnson is a 46 y.o. [phone] here for Mirena IUD removal and reinsertion. No GYN concerns.  Last pap smear was on 05/13/2020 and was normal.  IUD Removal and Reinsertion  Patient identified, informed consent performed, consent signed.   Discussed risks of irregular bleeding, cramping, infection, malpositioning or misplacement of the IUD outside the uterus which may require further procedures. Also discussed >99% contraception efficacy, increased risk of ectopic pregnancy with failure of method.   Emphasized that this did not protect against STIs, condoms recommended during all sexual encounters.Advised to use backup contraception for one week as the risk of pregnancy is higher during the transition period of removing an IUD and replacing it with another one. Time out was performed. Speculum placed in the vagina. The strings of the IUD were grasped and pulled using ring forceps. The IUD was successfully removed in its entirety. The cervix was cleaned with Betadine x 2 and grasped anteriorly with a single tooth tenaculum.  The new Mirena IUD insertion apparatus was used to sound the uterus to 7 cm;  the IUD was then placed per manufacturer's recommendations. Strings trimmed to 3 cm. Tenaculum was removed, good hemostasis noted. Patient tolerated procedure well.   Patient was given post-procedure instructions.  She was reminded to have backup contraception for one week during this transition period between IUDs.  Patient was also asked to check IUD strings periodically and follow up in 4 weeks for IUD check.   Philip Aspen, CNM

## 2021-05-20 NOTE — Patient Instructions (Signed)

## 2021-05-22 DIAGNOSIS — Z1211 Encounter for screening for malignant neoplasm of colon: Secondary | ICD-10-CM | POA: Diagnosis not present

## 2021-05-24 DIAGNOSIS — G4733 Obstructive sleep apnea (adult) (pediatric): Secondary | ICD-10-CM | POA: Diagnosis not present

## 2021-05-27 DIAGNOSIS — G4733 Obstructive sleep apnea (adult) (pediatric): Secondary | ICD-10-CM | POA: Diagnosis not present

## 2021-05-31 LAB — COLOGUARD: COLOGUARD: NEGATIVE

## 2021-06-04 ENCOUNTER — Encounter: Payer: BC Managed Care – PPO | Admitting: Family Medicine

## 2021-06-05 DIAGNOSIS — G4733 Obstructive sleep apnea (adult) (pediatric): Secondary | ICD-10-CM | POA: Diagnosis not present

## 2021-06-09 ENCOUNTER — Other Ambulatory Visit: Payer: Self-pay

## 2021-06-09 ENCOUNTER — Ambulatory Visit (INDEPENDENT_AMBULATORY_CARE_PROVIDER_SITE_OTHER): Payer: BC Managed Care – PPO | Admitting: Family Medicine

## 2021-06-09 ENCOUNTER — Encounter: Payer: Self-pay | Admitting: Family Medicine

## 2021-06-09 ENCOUNTER — Telehealth: Payer: Self-pay

## 2021-06-09 VITALS — BP 112/84 | HR 67 | Ht 66.0 in | Wt 277.0 lb

## 2021-06-09 DIAGNOSIS — E039 Hypothyroidism, unspecified: Secondary | ICD-10-CM | POA: Diagnosis not present

## 2021-06-09 DIAGNOSIS — Z1159 Encounter for screening for other viral diseases: Secondary | ICD-10-CM | POA: Diagnosis not present

## 2021-06-09 DIAGNOSIS — E785 Hyperlipidemia, unspecified: Secondary | ICD-10-CM

## 2021-06-09 DIAGNOSIS — Z0000 Encounter for general adult medical examination without abnormal findings: Secondary | ICD-10-CM | POA: Diagnosis not present

## 2021-06-09 DIAGNOSIS — Z1211 Encounter for screening for malignant neoplasm of colon: Secondary | ICD-10-CM

## 2021-06-09 DIAGNOSIS — F419 Anxiety disorder, unspecified: Secondary | ICD-10-CM

## 2021-06-09 DIAGNOSIS — Z7689 Persons encountering health services in other specified circumstances: Secondary | ICD-10-CM

## 2021-06-09 DIAGNOSIS — Z6841 Body Mass Index (BMI) 40.0 and over, adult: Secondary | ICD-10-CM

## 2021-06-09 DIAGNOSIS — Z85828 Personal history of other malignant neoplasm of skin: Secondary | ICD-10-CM

## 2021-06-09 DIAGNOSIS — Z114 Encounter for screening for human immunodeficiency virus [HIV]: Secondary | ICD-10-CM

## 2021-06-09 MED ORDER — WEGOVY 0.25 MG/0.5ML ~~LOC~~ SOAJ: 0.2500 mg | SUBCUTANEOUS | 0 refills | Status: DC

## 2021-06-09 NOTE — Assessment & Plan Note (Signed)
Recheck TSH ordered, will follow accordingly. ?

## 2021-06-09 NOTE — Telephone Encounter (Signed)
PA completed waiting for insurance approval. ? ?Key: [id number] ? ?KP ?

## 2021-06-09 NOTE — Assessment & Plan Note (Signed)
Cologuard results normal, recheck in 3 years. ?

## 2021-06-09 NOTE — Progress Notes (Signed)
?  ? ?Annual Physical Exam Visit ? ?Patient Information:  ?Patient ID: Holly Johnson, female DOB: Feb 23, 1976 Age: 46 y.o. MRN: [id number]  ? ?Subjective:  ? ?CC: Annual Physical Exam ? ?HPI:  ?Holly Johnson is here for their annual physical. ? ?I reviewed the past medical history, family history, social history, surgical history, and allergies today and changes were made as necessary.  Please see the problem list section below for additional details. ? ?Past Medical History: ?Past Medical History:  ?Diagnosis Date  ? Allergy   ? Anxiety   ? Basal cell carcinoma 2020  ? basal cell carcinoma  ? Biliary dyskinesia 07/25/2015  ? Chest pain   ? a. 02/2021 Cor CTA: Cor Ca2+ = 0. Nl cors. No significant noncardiac findings.  ? Morbid obesity ([place])   ? Palpitations   ? PSVT (paroxysmal supraventricular tachycardia) ([place])   ? a. 12/2020 Zio: Occas PSVT - fastest 162 x 7 beats, longest 9 beats @ 111.  ? Symptomatic PVC's (premature ventricular contractions)   ? a. 12/2020 Zio: 5.3% PVC burden. Triggered events assoc w/ PVCs.  ? ?Past Surgical History: ?Past Surgical History:  ?Procedure Laterality Date  ? BREAST BIOPSY Right 2017  ? fibroadenoma  ? CHOLECYSTECTOMY  2018  ? MOHS SURGERY  2020  ? x 3  ? ?Family History: ?Family History  ?Problem Relation Age of Onset  ? Hypertension Mother   ? Diabetes Mother   ? Osteoarthritis Mother   ? Hyperlipidemia Mother   ? Skin cancer Mother   ? Obesity Mother   ? Hypertension Father   ? Hyperlipidemia Father   ? Skin cancer Father   ? Gallbladder disease Father   ? Cancer Father   ?     gallbladder cancer  ? ADD / ADHD Sister   ? Anxiety disorder Sister   ? Hypertension Sister   ? Sleep apnea Sister   ? Heart disease Maternal Grandmother   ? Hypertension Maternal Grandmother   ? Osteoarthritis Maternal Grandmother   ? Thyroid disease Maternal Grandmother   ? Lung cancer Maternal Grandfather   ? Ovarian cancer Paternal Grandmother   ? Dementia Paternal Grandfather   ? ADD /  ADHD Daughter   ? Breast cancer Neg Hx   ? ?Allergies: ?No Known Allergies ?Health Maintenance: ?Health Maintenance  ?Topic Date Due  ? Hepatitis C Screening  Never done  ? COLONOSCOPY (Pts 45-74yr Insurance coverage will need to be confirmed)  Never done  ? COVID-19 Vaccine (5 - Booster for [place]) 02/24/2021  ? PAP SMEAR-Modifier  05/14/2023  ? TETANUS/TDAP  12/25/2030  ? INFLUENZA VACCINE  Completed  ? HIV Screening  Completed  ? HPV VACCINES  Aged Out  ?  ?HM Colonoscopy        ? ? Overdue - COLONOSCOPY (Pts 45-423yrInsurance coverage will need to be confirmed) (Every 10 Years) Overdue - never done  ? ? No completion history exists for this topic.  ? ?  ?  ? ?  ? ?Medications: ?Current Outpatient Medications on File Prior to Visit  ?Medication Sig Dispense Refill  ? cetirizine (ZYRTEC) 10 MG tablet Take 10 mg by mouth daily.    ? FLUoxetine (PROZAC) 10 MG capsule TAKE 1 CAPSULE BY MOUTH ONCE DAILY 90 capsule 0  ? levonorgestrel (MIRENA) 20 MCG/24HR IUD 1 each by Intrauterine route once.    ? metoprolol tartrate (LOPRESSOR) 25 MG tablet Take 0.5 tablets (12.5 mg total) by mouth 2 (two) times  daily as needed (As needed for palpitations). 45 tablet 3  ? mometasone (NASONEX) 50 MCG/ACT nasal spray Place 2 sprays into the nose as needed.    ? SYNTHROID 88 MCG tablet TAKE 1 TABLET BY MOUTH ONCE DAILY ON AN EMPTY STOMACH. WAIT 30 MINUTES BEFORE TAKING OTHER MEDS. 90 tablet 1  ? VITAMIN D PO Take 2,000 Units by mouth daily.    ? ?No current facility-administered medications on file prior to visit.  ? ? ?Review of Systems: No headache, visual changes, nausea, vomiting, diarrhea, constipation, dizziness, abdominal pain, skin rash, fevers, chills, night sweats, swollen lymph nodes, weight loss, chest pain, body aches, joint swelling, muscle aches, shortness of breath, mood changes, visual or auditory hallucinations reported. ? ?Objective:  ? ?Vitals:  ? 06/09/21 0832  ?BP: 112/84  ?Pulse: 67  ?SpO2: 95%   ? ?Vitals:  ? 06/09/21 0832  ?Weight: 277 lb (125.6 kg)  ?Height: 5' 6"  (1.676 m)  ? ?Body mass index is 44.71 kg/m?. ? ?General: Well Developed, well nourished, and in no acute distress.  ?Neuro: Alert and oriented x3, extra-ocular muscles intact, sensation grossly intact. Cranial nerves II through XII are grossly intact, motor, sensory, and gross coordinative functions are intact.  Patient does have symmetric brachioradialis reflexes, unable to elicit patellar or right Achilles reflexes, diminished though present left Achilles reflex noted ?HEENT: Normocephalic, atraumatic, pupils equal round reactive to light, though diminished left retinal response noted, neck supple, no masses, no lymphadenopathy, thyroid nonpalpable. Oropharynx, nasopharynx, external ear canals are unremarkable. ?Skin: Warm and dry, no rashes noted.  ?Cardiac: Regular rate and rhythm, no murmurs rubs or gallops. No peripheral edema. Pulses symmetric. ?Respiratory: Clear to auscultation bilaterally. Not using accessory muscles, speaking in full sentences.  ?Abdominal: Soft, nontender, nondistended, positive bowel sounds, no masses, no organomegaly. ?Musculoskeletal: Shoulder, elbow, wrist, hip, knee, ankle stable, and with full range of motion. ? ?Female chaperone initials: KP present throughout the physical examination. ? ?Impression and Recommendations:  ? ?The patient was counselled, risk factors were discussed, and anticipatory guidance given. ? ?Acquired hypothyroidism ?Recheck TSH ordered, will follow accordingly. ? ?History of basal cell carcinoma (BCC) ?Has established with dermatology, recent check benign, has annual follow-up with them. ? ?Colon cancer screening ?Cologuard results normal, recheck in 3 years. ? ?Encounter for weight management ?Patient has been making consistent long-term lifestyle modifications, despite this she has had difficulty with weight, current BMI 44.71.  We have discussed additional pharmacologic and  nonpharmacologic methods for weight management, she is amenable to initiation of Wegovy, starter pack sent to pharmacy.  She will determine at a later date if she wishes to start this or not, if so, she was advised to contact her office that we can coordinate appropriate follow-up in 1 month's time. ? ?Annual physical exam ?Annual examination completed, risk stratification labs ordered, anticipatory guidance provided.  We will follow labs once resulted. ? ?Anxiety ?Very well controlled, infrequent bouts of symptomatology, has not required as needed metoprolol. ? ?Orders & Medications ?Medications:  ?Meds ordered this encounter  ?Medications  ? WEGOVY 0.25 MG/0.5ML SOAJ  ?  Sig: Inject 0.25 mg into the skin once a week. Use this dose for 1 month (4 shots) and then increase to next higher dose.  ?  Dispense:  2 mL  ?  Refill:  0  ? ?Orders Placed This Encounter  ?Procedures  ? HIV Antibody (routine testing w rflx)  ? Hepatitis C antibody  ? Apo A1 + B + Ratio  ?  Comprehensive metabolic panel  ? Lipid panel  ? TSH  ?  ? ?Return in about 1 year (around 06/10/2022) for Annual Physical.  ? ? ?Montel Culver, MD ? ? Primary Care Sports Medicine ?[facility] ?[place]  ? ?

## 2021-06-09 NOTE — Patient Instructions (Addendum)
-   Obtain fasting labs with orders provided (can have water or black coffee but otherwise no food or drink x 8 hours before labs) ?- Review information provided ?- If starting Wegovy, contact her office so we can coordinate a follow-up in 1 month's time ?- Attend eye doctor annually, dentist every 6 months, work towards or maintain 30 minutes of moderate intensity physical activity at least 5 days per week, and consume a balanced diet ?- Return in 1 year for physical ?- Contact us for any questions between now and then ?

## 2021-06-09 NOTE — Assessment & Plan Note (Signed)
Has established with dermatology, recent check benign, has annual follow-up with them. ?

## 2021-06-09 NOTE — Assessment & Plan Note (Signed)
Annual examination completed, risk stratification labs ordered, anticipatory guidance provided.  We will follow labs once resulted. ?

## 2021-06-09 NOTE — Assessment & Plan Note (Signed)
Patient has been making consistent long-term lifestyle modifications, despite this she has had difficulty with weight, current BMI 44.71.  We have discussed additional pharmacologic and nonpharmacologic methods for weight management, she is amenable to initiation of Wegovy, starter pack sent to pharmacy.  She will determine at a later date if she wishes to start this or not, if so, she was advised to contact her office that we can coordinate appropriate follow-up in 1 month's time. ?

## 2021-06-09 NOTE — Assessment & Plan Note (Signed)
Very well controlled, infrequent bouts of symptomatology, has not required as needed metoprolol. ?

## 2021-06-10 LAB — COMPREHENSIVE METABOLIC PANEL
ALT: 32 IU/L (ref 0–32)
AST: 25 IU/L (ref 0–40)
Albumin/Globulin Ratio: 2.1 (ref 1.2–2.2)
Albumin: 4.6 g/dL (ref 3.8–4.8)
Alkaline Phosphatase: 68 IU/L (ref 44–121)
BUN/Creatinine Ratio: 13 (ref 9–23)
BUN: 11 mg/dL (ref 6–24)
Bilirubin Total: 0.5 mg/dL (ref 0.0–1.2)
CO2: 26 mmol/L (ref 20–29)
Calcium: 9.5 mg/dL (ref 8.7–10.2)
Chloride: 102 mmol/L (ref 96–106)
Creatinine, Ser: 0.88 mg/dL (ref 0.57–1.00)
Globulin, Total: 2.2 g/dL (ref 1.5–4.5)
Glucose: 95 mg/dL (ref 70–99)
Potassium: 4.3 mmol/L (ref 3.5–5.2)
Sodium: 139 mmol/L (ref 134–144)
Total Protein: 6.8 g/dL (ref 6.0–8.5)
eGFR: 83 mL/min/{1.73_m2} (ref >59)

## 2021-06-10 LAB — LIPID PANEL
Chol/HDL Ratio: 5.2 ratio — ABNORMAL HIGH (ref 0.0–4.4)
Cholesterol, Total: 199 mg/dL (ref 100–199)
HDL: 38 mg/dL — ABNORMAL LOW (ref >39)
LDL Chol Calc (NIH): 138 mg/dL — ABNORMAL HIGH (ref 0–99)
Triglycerides: 125 mg/dL (ref 0–149)
VLDL Cholesterol Cal: 23 mg/dL (ref 5–40)

## 2021-06-10 LAB — TSH: TSH: 3.42 u[IU]/mL (ref 0.450–4.500)

## 2021-06-10 LAB — HIV ANTIBODY (ROUTINE TESTING W REFLEX): HIV Screen 4th Generation wRfx: NONREACTIVE

## 2021-06-10 LAB — APO A1 + B + RATIO
Apolipo. B/A-1 Ratio: 0.8 ratio — ABNORMAL HIGH (ref 0.0–0.6)
Apolipoprotein A-1: 126 mg/dL (ref 116–209)
Apolipoprotein B: 104 mg/dL — ABNORMAL HIGH (ref <90)

## 2021-06-10 LAB — HEPATITIS C ANTIBODY: Hep C Virus Ab: NONREACTIVE

## 2021-06-12 NOTE — Telephone Encounter (Signed)
Approved  ? ?06/11/2021 through 10/14/2021. ? ?KP ?

## 2021-06-16 ENCOUNTER — Encounter: Payer: Self-pay | Admitting: Family Medicine

## 2021-06-17 ENCOUNTER — Encounter: Payer: Self-pay | Admitting: Certified Nurse Midwife

## 2021-06-17 ENCOUNTER — Ambulatory Visit: Payer: BC Managed Care – PPO | Admitting: Certified Nurse Midwife

## 2021-06-17 ENCOUNTER — Other Ambulatory Visit: Payer: Self-pay

## 2021-06-17 VITALS — BP 136/90 | HR 70 | Ht 66.0 in | Wt 274.8 lb

## 2021-06-17 DIAGNOSIS — Z30431 Encounter for routine checking of intrauterine contraceptive device: Secondary | ICD-10-CM | POA: Diagnosis not present

## 2021-06-17 NOTE — Progress Notes (Signed)
? ? ?  GYNECOLOGY OFFICE ENCOUNTER NOTE ? ?History:  ?46 y.o. [id number] here today for today for IUD string check; Mirena  IUD was placed  05/20/21. No complaints about the IUD, no concerning side effects. ? ?The following portions of the patient's history were reviewed and updated as appropriate: allergies, current medications, past family history, past medical history, past social history, past surgical history and problem list. Last pap smear on 05/13/2020 was normal, negative HRHPV. ? ?Review of Systems:  ?Pertinent items are noted in HPI. ?  ?Objective:  ?Physical Exam ?Blood pressure 136/90, pulse 70, height 5' 6"  (1.676 m), weight 274 lb 12.8 oz (124.6 kg). ?CONSTITUTIONAL: Well-developed, well-nourished female in no acute distress.  ?NEUROLOGIC: Alert and oriented to person, place, and time. Normal reflexes, muscle tone coordination.  ?PSYCHIATRIC: Normal mood and affect. Normal behavior. Normal judgment and thought content. ?CARDIOVASCULAR: Normal heart rate noted ?RESPIRATORY: Effort and breath sounds normal, no problems with respiration noted ?ABDOMEN: Soft, no distention noted.   ?PELVIC: Normal appearing external genitalia; normal appearing vaginal mucosa and cervix.  IUD strings visualized, about 3 cm in length outside cervix. Done in the presence of a chaperone.  ? ?Assessment & Plan:  ?Patient to keep IUD in place for up to eight years; can come in for removal earlier if she desires or for any concerning side effects. Recommended condoms for STI prevention. ? ? ?Philip Aspen, CNM  ?

## 2021-06-19 DIAGNOSIS — G4733 Obstructive sleep apnea (adult) (pediatric): Secondary | ICD-10-CM | POA: Diagnosis not present

## 2021-06-21 DIAGNOSIS — G4733 Obstructive sleep apnea (adult) (pediatric): Secondary | ICD-10-CM | POA: Diagnosis not present

## 2021-07-16 ENCOUNTER — Encounter: Payer: Self-pay | Admitting: Family Medicine

## 2021-07-16 ENCOUNTER — Ambulatory Visit: Payer: BC Managed Care – PPO | Admitting: Family Medicine

## 2021-07-16 ENCOUNTER — Other Ambulatory Visit: Payer: Self-pay | Admitting: Family Medicine

## 2021-07-16 VITALS — BP 120/80 | HR 80 | Ht 66.5 in | Wt 273.0 lb

## 2021-07-16 DIAGNOSIS — Z7689 Persons encountering health services in other specified circumstances: Secondary | ICD-10-CM

## 2021-07-16 DIAGNOSIS — Z6841 Body Mass Index (BMI) 40.0 and over, adult: Secondary | ICD-10-CM

## 2021-07-16 DIAGNOSIS — F419 Anxiety disorder, unspecified: Secondary | ICD-10-CM | POA: Diagnosis not present

## 2021-07-16 MED ORDER — WEGOVY 0.5 MG/0.5ML ~~LOC~~ SOAJ: 0.5000 mg | SUBCUTANEOUS | 0 refills | Status: DC

## 2021-07-16 NOTE — Assessment & Plan Note (Signed)
Patient with reassuring PHQ and GAD scores, that being said she endorses increased anxiety citing work-related time obligations and associated stressors.  She has plans to reestablished with therapy/counseling.  Over the interim I did advise possible titration of her current fluoxetine 10 mg.  At this time she has opted to defer this change but will contact us if interested in performing the same. ?

## 2021-07-16 NOTE — Telephone Encounter (Signed)
Requested by interface surescripts. Dose changed. 0.25 mg and discontinued 07/16/21 ?Requested Prescriptions  ?Refused Prescriptions Disp Refills  ?? WEGOVY 0.25 MG/0.5ML SOAJ [Pharmacy Med Name: WEGOVY 0.25 MG/0.5ML SUBQ SOLN ML] 2 mL 0  ?  Sig: INJECT 0.25MG SUBCUTANEOUSLY ONCE A WEEKFOR 1 MONTH THEN INCREASE  ?  ? Endocrinology:  Diabetes - GLP-1 Receptor Agonists - semaglutide Failed - 07/16/2021 11:51 AM  ?  ?  Failed - HBA1C in normal range and within 180 days  ?  No results found for: HGBA1C, LABA1C   ?  ?  Passed - Cr in normal range and within 360 days  ?  Creatinine, Ser  ?Date Value Ref Range Status  ?06/09/2021 0.88 0.57 - 1.00 mg/dL Final  ?   ?  ?  Passed - Valid encounter within last 6 months  ?  Recent Outpatient Visits   ?      ? Today Class 3 severe obesity without serious comorbidity with body mass index (BMI) of 40.0 to 44.9 in adult, unspecified obesity type ([place])  ? [facility] Montel Culver, MD  ? 1 month ago Annual physical exam  ? [facility] Montel Culver, MD  ? 2 months ago Colon cancer screening  ? [place], [place]  ? 6 months ago Palpitations  ? [facility] Montel Culver, MD  ?  ?  ?Future Appointments   ?        ? In 2 months Zigmund Daniel, Earley Abide, MD [facility], [place]  ? In 11 months Zigmund Daniel Earley Abide, MD [facility], [place]  ?  ? ?  ?  ?  ? ?

## 2021-07-16 NOTE — Addendum Note (Signed)
Addended by: Delia Heady on: 07/16/2021 01:30 PM ? ? Modules accepted: Orders ? ?

## 2021-07-16 NOTE — Patient Instructions (Signed)
-   Take new Wegovy 0.5 mg dose weekly ?- Continue with nutrition coach ?- Contact our office after 4 weeks for further dose change/discussion ?- Return for formal follow-up in 2 months ?

## 2021-07-16 NOTE — Assessment & Plan Note (Addendum)
Patient has tolerated 4 weeks of Wegovy 0.25 mg with mild adverse GI effects primarily linked to dietary indulgences/food choices.  She has been working with the Gaffer through [place] and making appropriate interventions from a lifestyle standpoint.  We discussed the adjunct usage of moderate intensity physical activity and, from a medication management standpoint, further titration of Wegovy to 0.5 mg weekly.  She is to contact us in 4 weeks to provide a status update, if tolerating well, anticipate further titration.  If not, can maintain at the 0.5 mg dose versus titrate down to 0.25 mg.  Formal visit in 2 months for in clinic weight check. ? ?Chronic condition, symptomatic, Rx management ?

## 2021-07-16 NOTE — Progress Notes (Signed)
?  ? ?  Primary Care / Sports Medicine Office Visit ? ?Patient Information:  ?Patient ID: Holly Johnson, female DOB: 04-26-1975 Age: 46 y.o. MRN: [id number]  ? ?Holly Johnson is a pleasant 46 y.o. female presenting with the following: ? ?Chief Complaint  ?Patient presents with  ? weight management   ?  Pt is on Wegovy, no side effects.   ? ? ?Vitals:  ? 07/16/21 0841  ?BP: 120/80  ?Pulse: 80  ?SpO2: 97%  ? ?Vitals:  ? 07/16/21 0841  ?Weight: 273 lb (123.8 kg)  ?Height: 5' 6.5" (1.689 m)  ? ?Body mass index is 43.4 kg/m?. ? ?No results found.  ? ?Independent interpretation of notes and tests performed by another provider:  ? ?None ? ?Procedures performed:  ? ?None ? ?Pertinent History, Exam, Impression, and Recommendations:  ? ?Problem List Items Addressed This Visit   ? ?  ? Other  ? Class 3 severe obesity without serious comorbidity with body mass index (BMI) of 40.0 to 44.9 in adult [facility]) - Primary  ?  Patient has tolerated 4 weeks of Wegovy 0.25 mg with mild adverse GI effects primarily linked to dietary indulgences/food choices.  She has been working with the Gaffer through [place] and making appropriate interventions from a lifestyle standpoint.  We discussed the adjunct usage of moderate intensity physical activity and, from a medication management standpoint, further titration of Wegovy to 0.5 mg weekly.  She is to contact us in 4 weeks to provide a status update, if tolerating well, anticipate further titration.  If not, can maintain at the 0.5 mg dose versus titrate down to 0.25 mg.  Formal visit in 2 months for in clinic weight check. ? ?Chronic condition, symptomatic, Rx management ? ?  ?  ? Relevant Medications  ? WEGOVY 0.5 MG/0.5ML SOAJ  ? Anxiety  ?  Patient with reassuring PHQ and GAD scores, that being said she endorses increased anxiety citing work-related time obligations and associated stressors.  She has plans to reestablished with therapy/counseling.  Over the interim I did  advise possible titration of her current fluoxetine 10 mg.  At this time she has opted to defer this change but will contact us if interested in performing the same. ? ?  ?  ? Encounter for weight management  ? Relevant Medications  ? WEGOVY 0.5 MG/0.5ML SOAJ  ?  ? ?Orders & Medications ?Meds ordered this encounter  ?Medications  ? WEGOVY 0.5 MG/0.5ML SOAJ  ?  Sig: Inject 0.5 mg into the skin once a week. Use this dose for 1 month (4 shots) and then increase to next higher dose.  ?  Dispense:  2 mL  ?  Refill:  0  ? ?No orders of the defined types were placed in this encounter. ?  ? ?Return in about 2 months (around 09/15/2021).  ?  ? ?Montel Culver, MD ? ? Primary Care Sports Medicine ?[facility] ?[place]  ? ?

## 2021-07-17 ENCOUNTER — Other Ambulatory Visit: Payer: Self-pay

## 2021-07-17 MED ORDER — WEGOVY 0.5 MG/0.5ML ~~LOC~~ SOAJ: 0.5000 mg | SUBCUTANEOUS | 0 refills | Status: DC

## 2021-07-22 DIAGNOSIS — G4733 Obstructive sleep apnea (adult) (pediatric): Secondary | ICD-10-CM | POA: Diagnosis not present

## 2021-07-27 LAB — COLOGUARD: Cologuard: NEGATIVE

## 2021-07-31 ENCOUNTER — Other Ambulatory Visit: Payer: Self-pay | Admitting: Family Medicine

## 2021-07-31 DIAGNOSIS — E039 Hypothyroidism, unspecified: Secondary | ICD-10-CM

## 2021-07-31 DIAGNOSIS — F419 Anxiety disorder, unspecified: Secondary | ICD-10-CM

## 2021-07-31 NOTE — Telephone Encounter (Signed)
Requested Prescriptions  ?Pending Prescriptions Disp Refills  ?? SYNTHROID 88 MCG tablet [Pharmacy Med Name: SYNTHROID 88 MCG TAB] 90 tablet 1  ?  Sig: TAKE 1 TABLET BY MOUTH ONCE DAILY ON AN EMPTY STOMACH. WAIT 30 MINUTES BEFORE TAKING OTHER MEDS.  ?  ? Endocrinology:  Hypothyroid Agents Passed - 07/31/2021  8:21 AM  ?  ?  Passed - TSH in normal range and within 360 days  ?  TSH  ?Date Value Ref Range Status  ?06/09/2021 3.420 0.450 - 4.500 uIU/mL Final  ?   ?  ?  Passed - Valid encounter within last 12 months  ?  Recent Outpatient Visits   ?      ? 2 weeks ago Class 3 severe obesity without serious comorbidity with body mass index (BMI) of 40.0 to 44.9 in adult, unspecified obesity type ([place])  ? [facility] Montel Culver, MD  ? 1 month ago Annual physical exam  ? [facility] Montel Culver, MD  ? 2 months ago Colon cancer screening  ? [place], [place]  ? 7 months ago Palpitations  ? [facility] Montel Culver, MD  ?  ?  ?Future Appointments   ?        ? In 1 month Zigmund Daniel, Earley Abide, MD [facility], [place]  ? In 10 months Zigmund Daniel Earley Abide, MD [facility], [place]  ?  ? ?  ?  ?  ?? FLUoxetine (PROZAC) 10 MG capsule [Pharmacy Med Name: FLUOXETINE HCL 10 MG CAP] 90 capsule 0  ?  Sig: TAKE 1 CAPSULE BY MOUTH ONCE DAILY  ?  ? Psychiatry:  Antidepressants - SSRI Passed - 07/31/2021  8:21 AM  ?  ?  Passed - Valid encounter within last 6 months  ?  Recent Outpatient Visits   ?      ? 2 weeks ago Class 3 severe obesity without serious comorbidity with body mass index (BMI) of 40.0 to 44.9 in adult, unspecified obesity type ([place])  ? [facility] Montel Culver, MD  ? 1 month ago Annual physical exam  ? [facility] Montel Culver, MD  ? 2 months ago Colon cancer screening  ? [place], [place]  ? 7 months ago Palpitations  ? [facility] Montel Culver, MD  ?  ?  ?Future Appointments   ?         ? In 1 month Zigmund Daniel, Earley Abide, MD [facility], [place]  ? In 10 months Zigmund Daniel Earley Abide, MD [facility], [place]  ?  ? ?  ?  ?  ? ?

## 2021-08-02 ENCOUNTER — Encounter: Payer: Self-pay | Admitting: Family Medicine

## 2021-08-04 ENCOUNTER — Other Ambulatory Visit: Payer: Self-pay

## 2021-08-04 DIAGNOSIS — Z7689 Persons encountering health services in other specified circumstances: Secondary | ICD-10-CM

## 2021-08-04 MED ORDER — WEGOVY 1 MG/0.5ML ~~LOC~~ SOAJ: 1.0000 mg | SUBCUTANEOUS | 0 refills | Status: DC

## 2021-08-04 NOTE — Telephone Encounter (Signed)
Send next dose?

## 2021-08-05 DIAGNOSIS — G4733 Obstructive sleep apnea (adult) (pediatric): Secondary | ICD-10-CM | POA: Diagnosis not present

## 2021-09-05 DIAGNOSIS — G4733 Obstructive sleep apnea (adult) (pediatric): Secondary | ICD-10-CM | POA: Diagnosis not present

## 2021-09-11 ENCOUNTER — Ambulatory Visit: Payer: BC Managed Care – PPO | Admitting: Family Medicine

## 2021-09-11 ENCOUNTER — Encounter: Payer: Self-pay | Admitting: Family Medicine

## 2021-09-11 VITALS — BP 124/82 | HR 78 | Ht 66.0 in | Wt 270.0 lb

## 2021-09-11 DIAGNOSIS — Z6841 Body Mass Index (BMI) 40.0 and over, adult: Secondary | ICD-10-CM | POA: Diagnosis not present

## 2021-09-11 MED ORDER — WEGOVY 1.7 MG/0.75ML ~~LOC~~ SOAJ: 1.7000 mg | SUBCUTANEOUS | 0 refills | Status: DC

## 2021-09-11 NOTE — Progress Notes (Signed)
     Primary Care / Sports Medicine Office Visit  Patient Information:  Patient ID: Holly Johnson, female DOB: Aug 08, 1975 Age: 46 y.o. MRN: [id number]   Holly Johnson is a pleasant 46 y.o. female presenting with the following:  Chief Complaint  Patient presents with   Weight Check    Pt tolerating medication well    Vitals:   09/11/21 0800  BP: 124/82  Pulse: 78  SpO2: 98%   Vitals:   09/11/21 0800  Weight: 270 lb (122.5 kg)  Height: 5' 6"  (1.676 m)   Body mass index is 43.58 kg/m.  No results found.   Independent interpretation of notes and tests performed by another provider:   None  Procedures performed:   None  Pertinent History, Exam, Impression, and Recommendations:   Problem List Items Addressed This Visit       Other   Class 3 severe obesity without serious comorbidity with body mass index (BMI) of 40.0 to 44.9 in adult [facility]) - Primary    Patient has been tolerating 1.0 mg weekly dose of Wegovy while with occasional society and stomach upset based on meal/meal type.  Overall she finds that she is tolerating the medication well and has been losing weight with this, up to 10 pounds reported on home scale with some fluctuations.  Through our reading she has decreased 4 pounds.  At this stage have advised her on further titration to 1.7 mg weekly, to be mindful of heightened similar responses as she has been noting, and follow-up in 1 month.      Relevant Medications   WEGOVY 1.7 MG/0.75ML SOAJ     Orders & Medications Meds ordered this encounter  Medications   WEGOVY 1.7 MG/0.75ML SOAJ    Sig: Inject 1.7 mg into the skin once a week. Use this dose for 1 month (4 shots) and then increase to next higher dose.    Dispense:  3 mL    Refill:  0   No orders of the defined types were placed in this encounter.    Return in about 4 weeks (around 10/09/2021).     Montel Culver, MD   Primary Care Sports Medicine [place]

## 2021-09-11 NOTE — Assessment & Plan Note (Signed)
Patient has been tolerating 1.0 mg weekly dose of Wegovy while with occasional society and stomach upset based on meal/meal type.  Overall she finds that she is tolerating the medication well and has been losing weight with this, up to 10 pounds reported on home scale with some fluctuations.  Through our reading she has decreased 4 pounds.  At this stage have advised her on further titration to 1.7 mg weekly, to be mindful of heightened similar responses as she has been noting, and follow-up in 1 month.

## 2021-09-11 NOTE — Patient Instructions (Addendum)
-   Continue healthy lifestyle changes as you have been - Increase to Wegovy 1.7 mg weekly - Return in 1 month - Contact for questions

## 2021-09-15 ENCOUNTER — Ambulatory Visit: Payer: BC Managed Care – PPO | Admitting: Family Medicine

## 2021-09-24 ENCOUNTER — Encounter: Payer: Self-pay | Admitting: Family Medicine

## 2021-09-24 ENCOUNTER — Other Ambulatory Visit: Payer: Self-pay | Admitting: Family Medicine

## 2021-09-24 DIAGNOSIS — Z1231 Encounter for screening mammogram for malignant neoplasm of breast: Secondary | ICD-10-CM

## 2021-09-24 DIAGNOSIS — N63 Unspecified lump in unspecified breast: Secondary | ICD-10-CM

## 2021-09-24 NOTE — Telephone Encounter (Signed)
Please advise, or FYI

## 2021-09-26 DIAGNOSIS — G4733 Obstructive sleep apnea (adult) (pediatric): Secondary | ICD-10-CM | POA: Diagnosis not present

## 2021-10-09 ENCOUNTER — Ambulatory Visit: Payer: BC Managed Care – PPO | Admitting: Family Medicine

## 2021-10-09 ENCOUNTER — Encounter: Payer: Self-pay | Admitting: Family Medicine

## 2021-10-09 VITALS — BP 132/90 | HR 78 | Ht 66.0 in | Wt 266.4 lb

## 2021-10-09 DIAGNOSIS — Z6841 Body Mass Index (BMI) 40.0 and over, adult: Secondary | ICD-10-CM

## 2021-10-09 DIAGNOSIS — F419 Anxiety disorder, unspecified: Secondary | ICD-10-CM | POA: Diagnosis not present

## 2021-10-09 DIAGNOSIS — B029 Zoster without complications: Secondary | ICD-10-CM | POA: Insufficient documentation

## 2021-10-09 MED ORDER — WEGOVY 2.4 MG/0.75ML ~~LOC~~ SOAJ: 2.4000 mg | SUBCUTANEOUS | 11 refills | Status: DC

## 2021-10-09 MED ORDER — FLUOXETINE HCL 20 MG PO CAPS: 20.0000 mg | ORAL_CAPSULE | Freq: Every day | ORAL | 0 refills | Status: DC

## 2021-10-09 NOTE — Assessment & Plan Note (Signed)
Patient with few week history of right thoracic burning and T1 distribution, denies any skin changes but has sensitivity light touch, the symptoms stop at the midline.  She did have chickenpox as a child.  Examination reveals no skin abnormalities in this distribution but she does have discomfort with light touch throughout this area.  Her clinical history, distribution of symptoms raise concern for atypical presentation of zoster without rash (Zoster S[place]).  We discussed treatment versus monitoring, given her relatively low symptoms, will continue to monitor, however symptoms progress, plan for initiation of pharmacotherapy.

## 2021-10-09 NOTE — Progress Notes (Signed)
     Primary Care / Sports Medicine Office Visit  Patient Information:  Patient ID: Mesha Schamberger, female DOB: [birth year] Age: 46 y.o. MRN: [id number]   Ebbie Sorenson is a pleasant 46 y.o. female presenting with the following:  Chief Complaint  Patient presents with   Weight Check    Vitals:   10/09/21 1434  BP: 132/90  Pulse: 78  SpO2: 98%   Vitals:   10/09/21 1434  Weight: 266 lb 6.4 oz (120.8 kg)  Height: '5\' 6"'$  (1.676 m)   Body mass index is 43 kg/m.  No results found.   Independent interpretation of notes and tests performed by another provider:   None  Procedures performed:   None  Pertinent History, Exam, Impression, and Recommendations:   Problem List Items Addressed This Visit       Other   Class 3 severe obesity without serious comorbidity with body mass index (BMI) of 40.0 to 44.9 in adult [facility]) - Primary    Tolerating Wegovy 1.7 mg well, some anorexia for which she is being vigilant about maintaining adequate daily food intake.  She has lost 11 pounds total since being on Wegovy looking at initial prescription date.  Plan for further titration to 2.4 mg weekly, continued work on lifestyle/dietary changes.  I did discuss concern over ratio between muscle and fat loss with ACZYSA and to encourage physical activity.  Plan for follow-up in 3 months.      Relevant Medications   WEGOVY 2.4 MG/0.75ML SOAJ   Anxiety    This is a worsening of PHQ and GAD scores, increased work-related stressors that have coincided.  She is amenable to further titration of fluoxetine to 20 mg daily.  We will reassess response in 3 months.      Relevant Medications   FLUoxetine (PROZAC) 20 MG capsule   Herpes zoster without complication    Patient with few week history of right thoracic burning and T1 distribution, denies any skin changes but has sensitivity light touch, the symptoms stop at the midline.  She did have chickenpox as a child.  Examination reveals no skin  abnormalities in this distribution but she does have discomfort with light touch throughout this area.  Her clinical history, distribution of symptoms raise concern for atypical presentation of zoster without rash (Zoster S[place]).  We discussed treatment versus monitoring, given her relatively low symptoms, will continue to monitor, however symptoms progress, plan for initiation of pharmacotherapy.        Orders & Medications Meds ordered this encounter  Medications   WEGOVY 2.4 MG/0.75ML SOAJ    Sig: Inject 2.4 mg into the skin once a week.    Dispense:  3 mL    Refill:  11   FLUoxetine (PROZAC) 20 MG capsule    Sig: Take 1 capsule (20 mg total) by mouth daily.    Dispense:  90 capsule    Refill:  0   No orders of the defined types were placed in this encounter.    Return in about 3 months (around 01/09/2022).     Montel Culver, MD   Primary Care Sports Medicine [place]

## 2021-10-09 NOTE — Assessment & Plan Note (Signed)
Tolerating Wegovy 1.7 mg well, some anorexia for which she is being vigilant about maintaining adequate daily food intake.  She has lost 11 pounds total since being on Wegovy looking at initial prescription date.  Plan for further titration to 2.4 mg weekly, continued work on lifestyle/dietary changes.  I did discuss concern over ratio between muscle and fat loss with JYLTEI and to encourage physical activity.  Plan for follow-up in 3 months.

## 2021-10-09 NOTE — Assessment & Plan Note (Signed)
This is a worsening of PHQ and GAD scores, increased work-related stressors that have coincided.  She is amenable to further titration of fluoxetine to 20 mg daily.  We will reassess response in 3 months.

## 2021-10-09 NOTE — Patient Instructions (Addendum)
-   Start increased dose of fluoxetine 20 mg daily - Start increased Wegovy 2.4 mg weekly after 4 doses of current 1.7 mg dose - Recommend physical activity, weight training in addition to healthy lifestyle changes as you have been doing - Monitor zoster symptoms, if worsening, contact our office for medications - Return for follow-up in 3 months

## 2021-10-17 ENCOUNTER — Other Ambulatory Visit: Payer: BC Managed Care – PPO

## 2021-10-26 DIAGNOSIS — G4733 Obstructive sleep apnea (adult) (pediatric): Secondary | ICD-10-CM | POA: Diagnosis not present

## 2021-11-03 ENCOUNTER — Telehealth: Payer: Self-pay

## 2021-11-03 NOTE — Telephone Encounter (Signed)
PA completed waiting on insurance approval.  Key: [id number]  KP

## 2021-11-04 ENCOUNTER — Ambulatory Visit
Admission: RE | Admit: 2021-11-04 | Discharge: 2021-11-04 | Disposition: A | Discharge status: Home / Self Care | Payer: BC Managed Care – PPO | Source: Ambulatory Visit | Attending: Family Medicine | Admitting: Family Medicine

## 2021-11-04 DIAGNOSIS — N63 Unspecified lump in unspecified breast: Secondary | ICD-10-CM

## 2021-11-04 DIAGNOSIS — Z1231 Encounter for screening mammogram for malignant neoplasm of breast: Secondary | ICD-10-CM

## 2021-11-04 DIAGNOSIS — R928 Other abnormal and inconclusive findings on diagnostic imaging of breast: Secondary | ICD-10-CM | POA: Diagnosis not present

## 2021-11-04 DIAGNOSIS — N6311 Unspecified lump in the right breast, upper outer quadrant: Secondary | ICD-10-CM | POA: Diagnosis not present

## 2021-11-26 DIAGNOSIS — G4733 Obstructive sleep apnea (adult) (pediatric): Secondary | ICD-10-CM | POA: Diagnosis not present

## 2021-12-09 DIAGNOSIS — H524 Presbyopia: Secondary | ICD-10-CM | POA: Diagnosis not present

## 2021-12-09 DIAGNOSIS — H259 Unspecified age-related cataract: Secondary | ICD-10-CM | POA: Diagnosis not present

## 2021-12-09 DIAGNOSIS — H52223 Regular astigmatism, bilateral: Secondary | ICD-10-CM | POA: Diagnosis not present

## 2021-12-09 DIAGNOSIS — H5203 Hypermetropia, bilateral: Secondary | ICD-10-CM | POA: Diagnosis not present

## 2021-12-22 DIAGNOSIS — H25042 Posterior subcapsular polar age-related cataract, left eye: Secondary | ICD-10-CM | POA: Diagnosis not present

## 2021-12-28 HISTORY — PX: CATARACT EXTRACTION: SUR2

## 2022-01-01 DIAGNOSIS — H25042 Posterior subcapsular polar age-related cataract, left eye: Secondary | ICD-10-CM | POA: Diagnosis not present

## 2022-01-01 DIAGNOSIS — H269 Unspecified cataract: Secondary | ICD-10-CM | POA: Diagnosis not present

## 2022-01-13 ENCOUNTER — Ambulatory Visit: Payer: BC Managed Care – PPO | Admitting: Family Medicine

## 2022-01-13 ENCOUNTER — Encounter: Payer: Self-pay | Admitting: Family Medicine

## 2022-01-13 VITALS — BP 130/82 | HR 78 | Ht 66.0 in | Wt 256.6 lb

## 2022-01-13 DIAGNOSIS — F419 Anxiety disorder, unspecified: Secondary | ICD-10-CM | POA: Diagnosis not present

## 2022-01-13 DIAGNOSIS — Z7689 Persons encountering health services in other specified circumstances: Secondary | ICD-10-CM | POA: Diagnosis not present

## 2022-01-13 DIAGNOSIS — R030 Elevated blood-pressure reading, without diagnosis of hypertension: Secondary | ICD-10-CM | POA: Diagnosis not present

## 2022-01-13 MED ORDER — FLUOXETINE HCL 20 MG PO CAPS: 20.0000 mg | ORAL_CAPSULE | Freq: Every day | ORAL | 1 refills | Status: DC

## 2022-01-13 NOTE — Progress Notes (Signed)
     Primary Care / Sports Medicine Office Visit  Patient Information:  Patient ID: Holly Johnson, female DOB: 23-Feb-1976 Age: 46 y.o. MRN: [id number]   Holly Johnson is a pleasant 46 y.o. female presenting with the following:  No chief complaint on file.   Vitals:   01/13/22 0758  BP: 130/82  Pulse: 78  SpO2: 99%   Vitals:   01/13/22 0758  Weight: 256 lb 9.6 oz (116.4 kg)  Height: '5\' 6"'$  (1.676 m)   Body mass index is 41.42 kg/m.  No results found.   Independent interpretation of notes and tests performed by another provider:   None  Procedures performed:   None  Pertinent History, Exam, Impression, and Recommendations:   Problem List Items Addressed This Visit       Other   Anxiety    Patient has tolerated increase to fluoxetine (20 mg) very well, this has been despite increased recent life/familial stressors.  Plan to continue at current dose, refills provided today.      Relevant Medications   FLUoxetine (PROZAC) 20 MG capsule   Encounter for weight management - Primary    Excellent continued interim weight loss, tolerating Wegovy 2.4 mg weekly well with only minor GI disturbance linked to specific food triggers, infrequent bouts of skipping meals, overall doing well.  Abdominal examination with normoactive bowel sounds, nontender, no masses.  Plan to continue at current weekly dose of 2.4 mg, reevaluation in 5 months.      Elevated blood pressure reading without diagnosis of hypertension    Noted on consecutive visits, stable, without cardiopulmonary complaints.  Cardiopulmonary physical examination findings are reassuring today.  At this stage continued work on lifestyle modifications encouraged.  She is already making these changes.  Plan to reevaluate at her return for annual physical coupled with updated labs.  If persistent elevation noted, can discuss pharmacotherapy.        Orders & Medications Meds ordered this encounter  Medications    FLUoxetine (PROZAC) 20 MG capsule    Sig: Take 1 capsule (20 mg total) by mouth daily.    Dispense:  90 capsule    Refill:  1   Orders Placed This Encounter  Procedures   Cologuard     Return for CPE.     Montel Culver, MD   Primary Care Sports Medicine [place]

## 2022-01-13 NOTE — Patient Instructions (Signed)
-   Continue Wegovy at current weekly dose - Continue fluoxetine daily - Review information on further lifestyle modifications to reduce risk of high blood pressure - Return March 2024 for annual physical - Contact us for any questions between now and then

## 2022-01-13 NOTE — Assessment & Plan Note (Signed)
Excellent continued interim weight loss, tolerating Wegovy 2.4 mg weekly well with only minor GI disturbance linked to specific food triggers, infrequent bouts of skipping meals, overall doing well.  Abdominal examination with normoactive bowel sounds, nontender, no masses.  Plan to continue at current weekly dose of 2.4 mg, reevaluation in 5 months.

## 2022-01-13 NOTE — Assessment & Plan Note (Signed)
Patient has tolerated increase to fluoxetine (20 mg) very well, this has been despite increased recent life/familial stressors.  Plan to continue at current dose, refills provided today.

## 2022-01-13 NOTE — Assessment & Plan Note (Signed)
Noted on consecutive visits, stable, without cardiopulmonary complaints.  Cardiopulmonary physical examination findings are reassuring today.  At this stage continued work on lifestyle modifications encouraged.  She is already making these changes.  Plan to reevaluate at her return for annual physical coupled with updated labs.  If persistent elevation noted, can discuss pharmacotherapy.

## 2022-01-15 DIAGNOSIS — Z86018 Personal history of other benign neoplasm: Secondary | ICD-10-CM | POA: Diagnosis not present

## 2022-01-15 DIAGNOSIS — D225 Melanocytic nevi of trunk: Secondary | ICD-10-CM | POA: Diagnosis not present

## 2022-01-15 DIAGNOSIS — D485 Neoplasm of uncertain behavior of skin: Secondary | ICD-10-CM | POA: Diagnosis not present

## 2022-01-15 DIAGNOSIS — L578 Other skin changes due to chronic exposure to nonionizing radiation: Secondary | ICD-10-CM | POA: Diagnosis not present

## 2022-01-22 DIAGNOSIS — H25811 Combined forms of age-related cataract, right eye: Secondary | ICD-10-CM | POA: Diagnosis not present

## 2022-01-22 DIAGNOSIS — H269 Unspecified cataract: Secondary | ICD-10-CM | POA: Diagnosis not present

## 2022-01-22 DIAGNOSIS — H2511 Age-related nuclear cataract, right eye: Secondary | ICD-10-CM | POA: Diagnosis not present

## 2022-01-26 ENCOUNTER — Encounter: Payer: Self-pay | Admitting: Family Medicine

## 2022-01-26 NOTE — Telephone Encounter (Signed)
Please advise 

## 2022-01-27 DIAGNOSIS — Z23 Encounter for immunization: Secondary | ICD-10-CM | POA: Diagnosis not present

## 2022-01-28 DIAGNOSIS — F4323 Adjustment disorder with mixed anxiety and depressed mood: Secondary | ICD-10-CM | POA: Diagnosis not present

## 2022-02-14 ENCOUNTER — Other Ambulatory Visit: Payer: Self-pay | Admitting: Family Medicine

## 2022-02-14 DIAGNOSIS — E039 Hypothyroidism, unspecified: Secondary | ICD-10-CM

## 2022-02-16 NOTE — Telephone Encounter (Signed)
Requested Prescriptions  Pending Prescriptions Disp Refills   SYNTHROID 88 MCG tablet [Pharmacy Med Name: SYNTHROID 88 MCG TAB] 90 tablet 1    Sig: TAKE 1 TABLET BY MOUTH ONCE DAILY ON AN EMPTY STOMACH. WAIT 30 MINUTES BEFORE TAKING OTHER MEDS.     Endocrinology:  Hypothyroid Agents Passed - 02/14/2022 11:01 AM      Passed - TSH in normal range and within 360 days    TSH  Date Value Ref Range Status  06/09/2021 3.420 0.450 - 4.500 uIU/mL Final         Passed - Valid encounter within last 12 months    Recent Outpatient Visits           1 month ago Encounter for weight management   [place] [facility] Medicine at [facility], Earley Abide, MD   4 months ago Class 3 severe obesity without serious comorbidity with body mass index (BMI) of 40.0 to 44.9 in adult, unspecified obesity type [facility])   [place] Primary Care and Sports Medicine at [facility], Earley Abide, MD   5 months ago Class 3 severe obesity without serious comorbidity with body mass index (BMI) of 40.0 to 44.9 in adult, unspecified obesity type [facility])   [place] Primary Care and Sports Medicine at [facility], Earley Abide, MD   7 months ago Class 3 severe obesity without serious comorbidity with body mass index (BMI) of 40.0 to 44.9 in adult, unspecified obesity type [facility])   [place] Primary Care and Sports Medicine at [facility], Earley Abide, MD   8 months ago Annual physical exam   [facility] Primary Care and Sports Medicine at [facility], Earley Abide, MD       Future Appointments             In 4 months Zigmund Daniel, Earley Abide, MD [facility] Primary Care and Sports Medicine at [facility], [facility]

## 2022-03-08 ENCOUNTER — Telehealth: Payer: BC Managed Care – PPO | Admitting: Physician Assistant

## 2022-03-08 DIAGNOSIS — J019 Acute sinusitis, unspecified: Secondary | ICD-10-CM | POA: Diagnosis not present

## 2022-03-08 DIAGNOSIS — B9689 Other specified bacterial agents as the cause of diseases classified elsewhere: Secondary | ICD-10-CM

## 2022-03-08 MED ORDER — AMOXICILLIN-POT CLAVULANATE 875-125 MG PO TABS: 1.0000 | ORAL_TABLET | Freq: Two times a day (BID) | ORAL | 0 refills | Status: DC

## 2022-03-08 NOTE — Progress Notes (Signed)

## 2022-03-12 ENCOUNTER — Telehealth: Payer: BC Managed Care – PPO | Admitting: Family Medicine

## 2022-03-12 DIAGNOSIS — B379 Candidiasis, unspecified: Secondary | ICD-10-CM

## 2022-03-12 MED ORDER — FLUCONAZOLE 150 MG PO TABS: 150.0000 mg | ORAL_TABLET | ORAL | 0 refills | Status: DC

## 2022-03-12 NOTE — Progress Notes (Signed)

## 2022-05-08 ENCOUNTER — Other Ambulatory Visit: Payer: Self-pay | Admitting: Family Medicine

## 2022-05-08 DIAGNOSIS — F419 Anxiety disorder, unspecified: Secondary | ICD-10-CM

## 2022-05-08 NOTE — Telephone Encounter (Signed)
Unable to refill per protocol, Rx request is too soon. Last refill 01/13/22 for 90 and 1 refill.  Requested Prescriptions  Pending Prescriptions Disp Refills   FLUoxetine (PROZAC) 20 MG capsule [Pharmacy Med Name: FLUOXETINE HCL 20 MG CAP] 90 capsule 1    Sig: TAKE 1 CAPSULE BY MOUTH ONCE DAILY     Psychiatry:  Antidepressants - SSRI Passed - 05/08/2022 10:01 AM      Passed - Valid encounter within last 6 months    Recent Outpatient Visits           3 months ago Encounter for weight management   [place] [facility] Care & Sports Medicine at [facility], Earley Abide, MD   7 months ago Class 3 severe obesity without serious comorbidity with body mass index (BMI) of 40.0 to 44.9 in adult, unspecified obesity type [facility])   [place] Primary Care & Sports Medicine at [facility], Earley Abide, MD   7 months ago Class 3 severe obesity without serious comorbidity with body mass index (BMI) of 40.0 to 44.9 in adult, unspecified obesity type [facility])   [place] Primary Care & Sports Medicine at [facility], Earley Abide, MD   9 months ago Class 3 severe obesity without serious comorbidity with body mass index (BMI) of 40.0 to 44.9 in adult, unspecified obesity type [facility])   [place] at [facility], Earley Abide, MD   11 months ago Annual physical exam   [place] at [facility], Earley Abide, MD       Future Appointments             In 1 month Zigmund Daniel, Earley Abide, MD [place] at [facility], [facility]

## 2022-05-18 DIAGNOSIS — F411 Generalized anxiety disorder: Secondary | ICD-10-CM | POA: Diagnosis not present

## 2022-06-01 DIAGNOSIS — G4733 Obstructive sleep apnea (adult) (pediatric): Secondary | ICD-10-CM | POA: Diagnosis not present

## 2022-06-01 DIAGNOSIS — F411 Generalized anxiety disorder: Secondary | ICD-10-CM | POA: Diagnosis not present

## 2022-06-11 ENCOUNTER — Encounter: Payer: BC Managed Care – PPO | Admitting: Family Medicine

## 2022-06-15 DIAGNOSIS — F411 Generalized anxiety disorder: Secondary | ICD-10-CM | POA: Diagnosis not present

## 2022-06-19 ENCOUNTER — Encounter: Payer: BC Managed Care – PPO | Admitting: Family Medicine

## 2022-06-24 ENCOUNTER — Ambulatory Visit: Payer: BC Managed Care – PPO | Admitting: Family Medicine

## 2022-06-24 ENCOUNTER — Encounter: Payer: Self-pay | Admitting: Family Medicine

## 2022-06-24 VITALS — BP 128/78 | HR 62 | Ht 66.5 in | Wt 256.0 lb

## 2022-06-24 DIAGNOSIS — Z0000 Encounter for general adult medical examination without abnormal findings: Secondary | ICD-10-CM | POA: Insufficient documentation

## 2022-06-24 DIAGNOSIS — E039 Hypothyroidism, unspecified: Secondary | ICD-10-CM | POA: Diagnosis not present

## 2022-06-24 DIAGNOSIS — E559 Vitamin D deficiency, unspecified: Secondary | ICD-10-CM

## 2022-06-24 DIAGNOSIS — Z1239 Encounter for other screening for malignant neoplasm of breast: Secondary | ICD-10-CM | POA: Insufficient documentation

## 2022-06-24 DIAGNOSIS — E782 Mixed hyperlipidemia: Secondary | ICD-10-CM | POA: Diagnosis not present

## 2022-06-24 DIAGNOSIS — Z7689 Persons encountering health services in other specified circumstances: Secondary | ICD-10-CM

## 2022-06-24 NOTE — Assessment & Plan Note (Signed)
Stable on current regimen, updated labs ordered.

## 2022-06-24 NOTE — Progress Notes (Signed)
Annual Physical Exam Visit  Patient Information:  Patient ID: Holly Johnson, female DOB: 17-Jun-1975 Age: 47 y.o. MRN: [id number]   Subjective:   CC: Annual Physical Exam  HPI:  Holly Johnson is here for their annual physical.  I reviewed the past medical history, family history, social history, surgical history, and allergies today and changes were made as necessary.  Please see the problem list section below for additional details.  Past Medical History: Past Medical History:  Diagnosis Date   Allergy    Anxiety    Basal cell carcinoma 2020   basal cell carcinoma   Biliary dyskinesia 07/25/2015   Chest pain    a. 02/2021 Cor CTA: Cor Ca2+ = 0. Nl cors. No significant noncardiac findings.   Morbid obesity (HCC)    Palpitations    PSVT (paroxysmal supraventricular tachycardia)    a. 12/2020 [facility]: Occas PSVT - fastest 162 x 7 beats, longest 9 beats @ 111.   Symptomatic PVC's (premature ventricular contractions)    a. 12/2020 [facility]: 5.3% PVC burden. Triggered events assoc w/ PVCs.   Past Surgical History: Past Surgical History:  Procedure Laterality Date   BREAST BIOPSY Right 2017   fibroadenoma   CATARACT EXTRACTION Bilateral 12/2021   CHOLECYSTECTOMY  2018   MOHS SURGERY  2020   x 3   Family History: Family History  Problem Relation Age of Onset   Hypertension Mother    Diabetes Mother    Osteoarthritis Mother    Hyperlipidemia Mother    Skin cancer Mother    Obesity Mother    Hypertension Father    Hyperlipidemia Father    Skin cancer Father    Gallbladder disease Father    Cancer Father        gallbladder cancer   ADD / ADHD Sister    Anxiety disorder Sister    Hypertension Sister    Sleep apnea Sister    Heart disease Maternal Grandmother    Hypertension Maternal Grandmother    Osteoarthritis Maternal Grandmother    Thyroid disease Maternal Grandmother    Lung cancer Maternal Grandfather    Ovarian cancer Paternal Grandmother    Dementia  Paternal Grandfather    ADD / ADHD Daughter    Breast cancer Neg Hx    Allergies: No Known Allergies Health Maintenance: Health Maintenance  Topic Date Due   PAP SMEAR-Modifier  05/14/2023   Fecal DNA (Cologuard)  07/27/2024   DTaP/Tdap/Td (2 - Td or Tdap) 12/25/2030   INFLUENZA VACCINE  Completed   Hepatitis C Screening  Completed   HIV Screening  Completed   HPV VACCINES  Aged Out   COVID-19 Vaccine  Discontinued    HM Colonoscopy     This patient has no relevant Health Maintenance data.      Medications: Current Outpatient Medications on File Prior to Visit  Medication Sig Dispense Refill   cetirizine (ZYRTEC) 10 MG tablet Take 10 mg by mouth daily.     FLUoxetine (PROZAC) 20 MG capsule Take 1 capsule (20 mg total) by mouth daily. 90 capsule 1   levonorgestrel (MIRENA) 20 MCG/24HR IUD 1 each by Intrauterine route once.     SYNTHROID 88 MCG tablet TAKE 1 TABLET BY MOUTH ONCE DAILY ON AN EMPTY STOMACH. WAIT 30 MINUTES BEFORE TAKING OTHER MEDS. 90 tablet 1   VITAMIN D PO Take 2,000 Units by mouth daily.     WEGOVY 2.4 MG/0.75ML SOAJ Inject 2.4 mg into the skin once  a week. 3 mL 11   No current facility-administered medications on file prior to visit.    Review of Systems: No headache, visual changes, nausea, vomiting, diarrhea, constipation, dizziness, abdominal pain, skin rash, fevers, chills, night sweats, swollen lymph nodes, weight loss, chest pain, body aches, joint swelling, muscle aches, shortness of breath, mood changes, visual or auditory hallucinations reported.  Objective:   Vitals:   06/24/22 0807  BP: 128/78  Pulse: 62   Vitals:   06/24/22 0807  Weight: 256 lb (116.1 kg)  Height: 5' 6.5" (1.689 m)   Body mass index is 40.7 kg/m.  General: Well Developed, well nourished, and in no acute distress.  Neuro: Alert and oriented x3, extra-ocular muscles intact, sensation grossly intact. Cranial nerves II through XII are grossly intact, motor, sensory, and  coordinative functions are intact. HEENT: Normocephalic, atraumatic, pupils equal round reactive to light, neck supple, no masses, no lymphadenopathy, thyroid nonpalpable. Oropharynx, nasopharynx, external ear canals are unremarkable. Skin: Warm and dry, no rashes noted.  Cardiac: Regular rate and rhythm, no murmurs rubs or gallops. No peripheral edema. Pulses symmetric. Respiratory: Clear to auscultation bilaterally. Not using accessory muscles, speaking in full sentences.  Abdominal: Soft, nontender, nondistended, positive bowel sounds, no masses, no organomegaly. Musculoskeletal: Shoulder, elbow, wrist, hip, knee, ankle stable, and with full range of motion.  Female chaperone initials: KG present throughout the physical examination.  Impression and Recommendations:   The patient was counselled, risk factors were discussed, and anticipatory guidance given.  Problem List Items Addressed This Visit       Endocrine   Acquired hypothyroidism    Stable on current regimen, updated labs ordered.      Relevant Orders   TSH     Other   Annual physical exam - Primary    Annual examination completed, risk stratification labs ordered, anticipatory guidance provided.  We will follow labs once resulted.      Relevant Orders   CBC   Comprehensive metabolic panel   Lipid panel   TSH   VITAMIN D 25 Hydroxy (Vit-D Deficiency, Fractures)   Encounter for screening for malignant neoplasm of breast    1 year follow-up ultrasound and mammogram ordered for 10/2022 timeframe.      Relevant Orders   [place] LIMITED ULTRASOUND INCLUDING AXILLA RIGHT BREAST   MM 3D DIAGNOSTIC MAMMOGRAM BILATERAL BREAST   Encounter for weight management    Has been on the [facility] for the past several months but relays some forgetfulness in weekly dosing.  Weight has been stable, reviewed the risk versus benefits of continued dosing given stable weight.  She will work on consistent weekly dosing, dietary/lifestyle changes,  will reassess in 3 months.      Mixed hyperlipidemia   Relevant Orders   Comprehensive metabolic panel   Lipid panel   Vitamin D deficiency   Relevant Orders   VITAMIN D 25 Hydroxy (Vit-D Deficiency, Fractures)   Other Visit Diagnoses     Healthcare maintenance       Relevant Orders   CBC   Comprehensive metabolic panel   Lipid panel   TSH   VITAMIN D 25 Hydroxy (Vit-D Deficiency, Fractures)        Orders & Medications Medications: No orders of the defined types were placed in this encounter.  Orders Placed This Encounter  Procedures   [place] LIMITED ULTRASOUND INCLUDING AXILLA RIGHT BREAST   MM 3D DIAGNOSTIC MAMMOGRAM BILATERAL BREAST   CBC   Comprehensive metabolic panel  Lipid panel   TSH   VITAMIN D 25 Hydroxy (Vit-D Deficiency, Fractures)     Return in about 3 months (around 09/24/2022) for weight mgmt.    Montel Culver, MD, [facility]   Primary Care Sports Medicine Primary Care and Sports Medicine at [facility]

## 2022-06-24 NOTE — Assessment & Plan Note (Signed)
Annual examination completed, risk stratification labs ordered, anticipatory guidance provided.  We will follow labs once resulted. 

## 2022-06-24 NOTE — Assessment & Plan Note (Signed)
Has been on the [facility] for the past several months but relays some forgetfulness in weekly dosing.  Weight has been stable, reviewed the risk versus benefits of continued dosing given stable weight.  She will work on consistent weekly dosing, dietary/lifestyle changes, will reassess in 3 months.

## 2022-06-24 NOTE — Patient Instructions (Addendum)
-   Obtain fasting labs with orders provided (can have water or black coffee but otherwise no food or drink x 8 hours before labs) - Review information provided - Attend eye doctor annually, dentist every 6 months, work towards or maintain 30 minutes of moderate intensity physical activity at least 5 days per week, and consume a balanced diet - Return in 3 months - Contact us for any questions between now and then  Additionally: - Referral coordinator will contact to schedule ultrasound and mammogram - Maintain weekly Wegovy dosing and make dietary/lifestyle changes where appropriate

## 2022-06-24 NOTE — Assessment & Plan Note (Signed)
1 year follow-up ultrasound and mammogram ordered for 10/2022 timeframe.

## 2022-06-25 LAB — LIPID PANEL
Chol/HDL Ratio: 4.4 ratio (ref 0.0–4.4)
Cholesterol, Total: 207 mg/dL — ABNORMAL HIGH (ref 100–199)
HDL: 47 mg/dL (ref >39)
LDL Chol Calc (NIH): 142 mg/dL — ABNORMAL HIGH (ref 0–99)
Triglycerides: 100 mg/dL (ref 0–149)
VLDL Cholesterol Cal: 18 mg/dL (ref 5–40)

## 2022-06-25 LAB — COMPREHENSIVE METABOLIC PANEL
ALT: 20 IU/L (ref 0–32)
AST: 19 IU/L (ref 0–40)
Albumin/Globulin Ratio: 2.1 (ref 1.2–2.2)
Albumin: 4.6 g/dL (ref 3.9–4.9)
Alkaline Phosphatase: 66 IU/L (ref 44–121)
BUN/Creatinine Ratio: 13 (ref 9–23)
BUN: 12 mg/dL (ref 6–24)
Bilirubin Total: 0.3 mg/dL (ref 0.0–1.2)
CO2: 25 mmol/L (ref 20–29)
Calcium: 9.6 mg/dL (ref 8.7–10.2)
Chloride: 104 mmol/L (ref 96–106)
Creatinine, Ser: 0.92 mg/dL (ref 0.57–1.00)
Globulin, Total: 2.2 g/dL (ref 1.5–4.5)
Glucose: 86 mg/dL (ref 70–99)
Potassium: 4.8 mmol/L (ref 3.5–5.2)
Sodium: 142 mmol/L (ref 134–144)
Total Protein: 6.8 g/dL (ref 6.0–8.5)
eGFR: 78 mL/min/{1.73_m2} (ref >59)

## 2022-06-25 LAB — CBC
Hematocrit: 44.9 % (ref 34.0–46.6)
Hemoglobin: 14.6 g/dL (ref 11.1–15.9)
MCH: 29.4 pg (ref 26.6–33.0)
MCHC: 32.5 g/dL (ref 31.5–35.7)
MCV: 90 fL (ref 79–97)
Platelets: 307 10*3/uL (ref 150–450)
RBC: 4.97 x10E6/uL (ref 3.77–5.28)
RDW: 12.5 % (ref 11.7–15.4)
WBC: 7.7 10*3/uL (ref 3.4–10.8)

## 2022-06-25 LAB — TSH: TSH: 2.69 u[IU]/mL (ref 0.450–4.500)

## 2022-06-25 LAB — VITAMIN D 25 HYDROXY (VIT D DEFICIENCY, FRACTURES): Vit D, 25-Hydroxy: 56.2 ng/mL (ref 30.0–100.0)

## 2022-07-02 DIAGNOSIS — G4733 Obstructive sleep apnea (adult) (pediatric): Secondary | ICD-10-CM | POA: Diagnosis not present

## 2022-07-16 DIAGNOSIS — F411 Generalized anxiety disorder: Secondary | ICD-10-CM | POA: Diagnosis not present

## 2022-07-27 DIAGNOSIS — F411 Generalized anxiety disorder: Secondary | ICD-10-CM | POA: Diagnosis not present

## 2022-07-28 DIAGNOSIS — L578 Other skin changes due to chronic exposure to nonionizing radiation: Secondary | ICD-10-CM | POA: Diagnosis not present

## 2022-07-28 DIAGNOSIS — C44319 Basal cell carcinoma of skin of other parts of face: Secondary | ICD-10-CM | POA: Diagnosis not present

## 2022-07-28 DIAGNOSIS — Z872 Personal history of diseases of the skin and subcutaneous tissue: Secondary | ICD-10-CM | POA: Diagnosis not present

## 2022-07-28 DIAGNOSIS — D485 Neoplasm of uncertain behavior of skin: Secondary | ICD-10-CM | POA: Diagnosis not present

## 2022-07-28 DIAGNOSIS — Z86018 Personal history of other benign neoplasm: Secondary | ICD-10-CM | POA: Diagnosis not present

## 2022-07-28 DIAGNOSIS — Z85828 Personal history of other malignant neoplasm of skin: Secondary | ICD-10-CM | POA: Diagnosis not present

## 2022-08-01 DIAGNOSIS — G4733 Obstructive sleep apnea (adult) (pediatric): Secondary | ICD-10-CM | POA: Diagnosis not present

## 2022-08-17 ENCOUNTER — Other Ambulatory Visit: Payer: Self-pay | Admitting: Family Medicine

## 2022-08-17 DIAGNOSIS — F419 Anxiety disorder, unspecified: Secondary | ICD-10-CM

## 2022-08-18 NOTE — Telephone Encounter (Signed)
Requested by interface surescripts. Future visit in 1 month. Requested Prescriptions  Pending Prescriptions Disp Refills   FLUoxetine (PROZAC) 20 MG capsule [Pharmacy Med Name: FLUOXETINE HCL 20 MG CAP] 90 capsule 1    Sig: TAKE 1 CAPSULE BY MOUTH ONCE DAILY     Psychiatry:  Antidepressants - SSRI Passed - 08/17/2022  9:45 AM      Passed - Valid encounter within last 6 months    Recent Outpatient Visits           1 month ago Annual physical exam   [place] [facility] Medicine at [facility], Ocie Bob, MD   7 months ago Encounter for weight management   [employer] Primary Care & Sports Medicine at [facility], Ocie Bob, MD   10 months ago Class 3 severe obesity without serious comorbidity with body mass index (BMI) of 40.0 to 44.9 in adult, unspecified obesity type [facility])   [place] Primary Care & Sports Medicine at [facility], Ocie Bob, MD   11 months ago Class 3 severe obesity without serious comorbidity with body mass index (BMI) of 40.0 to 44.9 in adult, unspecified obesity type [facility])   [place] Primary Care & Sports Medicine at [facility], Ocie Bob, MD   1 year ago Class 3 severe obesity without serious comorbidity with body mass index (BMI) of 40.0 to 44.9 in adult, unspecified obesity type [facility])   [place] Primary Care & Sports Medicine at [facility], Ocie Bob, MD       Future Appointments             In 1 month Ashley Royalty, Ocie Bob, MD [facility] Primary Care & Sports Medicine at [facility], [facility]

## 2022-08-20 ENCOUNTER — Encounter: Payer: Self-pay | Admitting: Family Medicine

## 2022-09-03 ENCOUNTER — Other Ambulatory Visit: Payer: Self-pay | Admitting: Family Medicine

## 2022-09-03 DIAGNOSIS — E039 Hypothyroidism, unspecified: Secondary | ICD-10-CM

## 2022-09-04 MED ORDER — ACETAZOLAMIDE 250 MG PO TABS: ORAL_TABLET | ORAL | 0 refills | Status: DC

## 2022-09-04 NOTE — Telephone Encounter (Signed)
Requested Prescriptions  Pending Prescriptions Disp Refills   SYNTHROID 88 MCG tablet [Pharmacy Med Name: SYNTHROID 88 MCG TAB] 90 tablet 0    Sig: TAKE 1 TABLET BY MOUTH ONCE DAILY ON AN EMPTY STOMACH. WAIT 30 MINUTES BEFORE TAKING OTHER MEDS.     Endocrinology:  Hypothyroid Agents Passed - 09/03/2022  4:49 PM      Passed - TSH in normal range and within 360 days    TSH  Date Value Ref Range Status  06/24/2022 2.690 0.450 - 4.500 uIU/mL Final         Passed - Valid encounter within last 12 months    Recent Outpatient Visits           2 months ago Annual physical exam   [facility] at [facility], Ocie Bob, MD   7 months ago Encounter for weight management   [place] Primary Care & Sports Medicine at [facility], Ocie Bob, MD   11 months ago Class 3 severe obesity without serious comorbidity with body mass index (BMI) of 40.0 to 44.9 in adult, unspecified obesity type [facility])   [place] Primary Care & Sports Medicine at [facility], Ocie Bob, MD   11 months ago Class 3 severe obesity without serious comorbidity with body mass index (BMI) of 40.0 to 44.9 in adult, unspecified obesity type [facility])   [place] Primary Care & Sports Medicine at [facility], Ocie Bob, MD   1 year ago Class 3 severe obesity without serious comorbidity with body mass index (BMI) of 40.0 to 44.9 in adult, unspecified obesity type [facility])   [place] Primary Care & Sports Medicine at [facility], Ocie Bob, MD       Future Appointments             In 2 weeks Ashley Royalty, Ocie Bob, MD [facility] Primary Care & Sports Medicine at [facility], [facility]

## 2022-09-22 DIAGNOSIS — C44319 Basal cell carcinoma of skin of other parts of face: Secondary | ICD-10-CM | POA: Diagnosis not present

## 2022-09-22 DIAGNOSIS — Z85828 Personal history of other malignant neoplasm of skin: Secondary | ICD-10-CM | POA: Diagnosis not present

## 2022-09-22 DIAGNOSIS — L578 Other skin changes due to chronic exposure to nonionizing radiation: Secondary | ICD-10-CM | POA: Diagnosis not present

## 2022-09-24 ENCOUNTER — Ambulatory Visit: Payer: BC Managed Care – PPO | Admitting: Family Medicine

## 2022-09-24 ENCOUNTER — Encounter: Payer: Self-pay | Admitting: Family Medicine

## 2022-09-24 VITALS — BP 120/80 | HR 62 | Ht 66.5 in | Wt 261.0 lb

## 2022-09-24 DIAGNOSIS — Z7689 Persons encountering health services in other specified circumstances: Secondary | ICD-10-CM

## 2022-09-24 DIAGNOSIS — Z6841 Body Mass Index (BMI) 40.0 and over, adult: Secondary | ICD-10-CM | POA: Diagnosis not present

## 2022-09-24 DIAGNOSIS — E662 Morbid (severe) obesity with alveolar hypoventilation: Secondary | ICD-10-CM | POA: Diagnosis not present

## 2022-09-24 NOTE — Assessment & Plan Note (Signed)
See additional assessment(s) for plan details. 

## 2022-09-24 NOTE — Assessment & Plan Note (Signed)
Patient continues healthy lifestyle changes, wishes to pursue further weight loss without adjunct pharmacotherapy.  We reviewed treatment strategies, plan as follows:  - Discontinue semaglutide - Referral to weight management for further optimization - Continue current healthy lifestyle changes and advance where able to do so

## 2022-09-24 NOTE — Progress Notes (Signed)
     Primary Care / Sports Medicine Office Visit  Patient Information:  Patient ID: Holly Johnson, female DOB: 1976/03/25 Age: 47 y.o. MRN: [id number]   Holly Johnson is a pleasant 47 y.o. female presenting with the following:  No chief complaint on file.   Vitals:   09/24/22 0816  BP: 120/80  Pulse: 62  SpO2: 98%   Vitals:   09/24/22 0816  Weight: 261 lb (118.4 kg)  Height: 5' 6.5" (1.689 m)   Body mass index is 41.5 kg/m.  No results found.   Independent interpretation of notes and tests performed by another provider:   None  Procedures performed:   None  Pertinent History, Exam, Impression, and Recommendations:   Class 3 obesity with alveolar hypoventilation, serious comorbidity, and body mass index (BMI) of 40.0 to 44.9 in adult [facility]) Assessment & Plan: Patient continues healthy lifestyle changes, wishes to pursue further weight loss without adjunct pharmacotherapy.  We reviewed treatment strategies, plan as follows:  - Discontinue semaglutide - Referral to weight management for further optimization - Continue current healthy lifestyle changes and advance where able to do so  Orders: -     Amb Ref to Medical Weight Management  Encounter for weight management Assessment & Plan: See additional assessment(s) for plan details.  Orders: -     Amb Ref to Medical Weight Management   I provided a total time of 21 minutes including both face-to-face and non-face-to-face time on 09/24/2022 inclusive of time utilized for medical chart review, information gathering, care coordination with staff, and documentation completion.   Orders & Medications No orders of the defined types were placed in this encounter.  Orders Placed This Encounter  Procedures   Amb Ref to Medical Weight Management     No follow-ups on file.     Jerrol Banana, MD, [facility]   Primary Care Sports Medicine Primary Care and Sports Medicine at [facility]

## 2022-09-24 NOTE — Patient Instructions (Signed)
-   Discontinue semaglutide - Referral to weight management for further optimization - Continue current healthy lifestyle changes and advance where able to do so

## 2022-10-06 DIAGNOSIS — F411 Generalized anxiety disorder: Secondary | ICD-10-CM | POA: Diagnosis not present

## 2022-10-09 DIAGNOSIS — G4733 Obstructive sleep apnea (adult) (pediatric): Secondary | ICD-10-CM | POA: Diagnosis not present

## 2022-10-22 DIAGNOSIS — F411 Generalized anxiety disorder: Secondary | ICD-10-CM | POA: Diagnosis not present

## 2022-11-02 DIAGNOSIS — F411 Generalized anxiety disorder: Secondary | ICD-10-CM | POA: Diagnosis not present

## 2022-11-06 ENCOUNTER — Other Ambulatory Visit: Payer: BC Managed Care – PPO

## 2022-11-06 ENCOUNTER — Inpatient Hospital Stay: Admission: RE | Admit: 2022-11-06 | Payer: BC Managed Care – PPO | Source: Ambulatory Visit

## 2022-11-09 DIAGNOSIS — G4733 Obstructive sleep apnea (adult) (pediatric): Secondary | ICD-10-CM | POA: Diagnosis not present

## 2022-11-27 ENCOUNTER — Other Ambulatory Visit: Payer: Self-pay | Admitting: Family Medicine

## 2022-11-27 DIAGNOSIS — F419 Anxiety disorder, unspecified: Secondary | ICD-10-CM

## 2022-11-27 NOTE — Telephone Encounter (Signed)
Requested by interface surescripts  future visit in 7 months .  Requested Prescriptions  Pending Prescriptions Disp Refills   FLUoxetine (PROZAC) 20 MG capsule [Pharmacy Med Name: FLUOXETINE HCL 20 MG CAP] 90 capsule 0    Sig: TAKE 1 CAPSULE BY MOUTH ONCE DAILY     Psychiatry:  Antidepressants - SSRI Passed - 11/27/2022  2:15 PM      Passed - Valid encounter within last 6 months    Recent Outpatient Visits           2 months ago Class 3 obesity with alveolar hypoventilation, serious comorbidity, and body mass index (BMI) of 40.0 to 44.9 in adult [facility])   [place] Primary Care & Sports Medicine at [facility], Ocie Bob, MD   5 months ago Annual physical exam   [facility] Primary Care & [facility] Medicine at [facility], Ocie Bob, MD   10 months ago Encounter for weight management   [place] Primary Care & Sports Medicine at [facility], Ocie Bob, MD   1 year ago Class 3 severe obesity without serious comorbidity with body mass index (BMI) of 40.0 to 44.9 in adult, unspecified obesity type [facility])   [place] Primary Care & Sports Medicine at [facility], Ocie Bob, MD   1 year ago Class 3 severe obesity without serious comorbidity with body mass index (BMI) of 40.0 to 44.9 in adult, unspecified obesity type [facility])   [place] Primary Care & Sports Medicine at [facility], Ocie Bob, MD       Future Appointments             In 7 months Ashley Royalty, Ocie Bob, MD [facility] Primary Care & Sports Medicine at [facility], [facility]

## 2022-12-02 DIAGNOSIS — F411 Generalized anxiety disorder: Secondary | ICD-10-CM | POA: Diagnosis not present

## 2022-12-10 DIAGNOSIS — G4733 Obstructive sleep apnea (adult) (pediatric): Secondary | ICD-10-CM | POA: Diagnosis not present

## 2022-12-14 DIAGNOSIS — F411 Generalized anxiety disorder: Secondary | ICD-10-CM | POA: Diagnosis not present

## 2022-12-17 ENCOUNTER — Other Ambulatory Visit: Payer: Self-pay | Admitting: Family Medicine

## 2022-12-17 DIAGNOSIS — E039 Hypothyroidism, unspecified: Secondary | ICD-10-CM

## 2022-12-21 NOTE — Telephone Encounter (Signed)
Requested Prescriptions  Pending Prescriptions Disp Refills   SYNTHROID 88 MCG tablet [Pharmacy Med Name: SYNTHROID 88 MCG TAB] 90 tablet 0    Sig: TAKE 1 TABLET BY MOUTH ONCE DAILY ON AN EMPTY STOMACH. WAIT 30 MINUTES BEFORE TAKING OTHER MEDS.     Endocrinology:  Hypothyroid Agents Passed - 12/17/2022 12:50 PM      Passed - TSH in normal range and within 360 days    TSH  Date Value Ref Range Status  06/24/2022 2.690 0.450 - 4.500 uIU/mL Final         Passed - Valid encounter within last 12 months    Recent Outpatient Visits           2 months ago Class 3 obesity with alveolar hypoventilation, serious comorbidity, and body mass index (BMI) of 40.0 to 44.9 in adult [facility])   [place] Primary Care & Sports Medicine at [facility], Ocie Bob, MD   6 months ago Annual physical exam   [facility] Primary Care & Sports Medicine at [facility], Ocie Bob, MD   11 months ago Encounter for weight management   [place] Primary Care & Sports Medicine at [facility], Ocie Bob, MD   1 year ago Class 3 severe obesity without serious comorbidity with body mass index (BMI) of 40.0 to 44.9 in adult, unspecified obesity type [facility])   [place] Primary Care & Sports Medicine at [facility], Ocie Bob, MD   1 year ago Class 3 severe obesity without serious comorbidity with body mass index (BMI) of 40.0 to 44.9 in adult, unspecified obesity type [facility])   [place] Primary Care & Sports Medicine at [facility], Ocie Bob, MD       Future Appointments             In 6 months Ashley Royalty, Ocie Bob, MD [facility] Primary Care & Sports Medicine at [facility], [facility]

## 2023-01-12 DIAGNOSIS — F411 Generalized anxiety disorder: Secondary | ICD-10-CM | POA: Diagnosis not present

## 2023-01-25 DIAGNOSIS — F411 Generalized anxiety disorder: Secondary | ICD-10-CM | POA: Diagnosis not present

## 2023-02-02 DIAGNOSIS — Z23 Encounter for immunization: Secondary | ICD-10-CM | POA: Diagnosis not present

## 2023-02-11 IMAGING — MG MM DIGITAL SCREENING BILAT W/ TOMO AND CAD
8 of 15 series · 8 of 40 positions shown · non-contrast
Comparison: None.
COMPARISON: None.

Addendum:
CLINICAL DATA: Screening.

EXAM:
DIGITAL SCREENING BILATERAL MAMMOGRAM WITH TOMOSYNTHESIS AND CAD
TECHNIQUE: Bilateral screening digital craniocaudal and mediolateral oblique
mammograms were obtained. Bilateral screening digital breast
tomosynthesis was performed. The images were evaluated with
computer-aided detection.

[L CC synth-2D (1 of 2)]
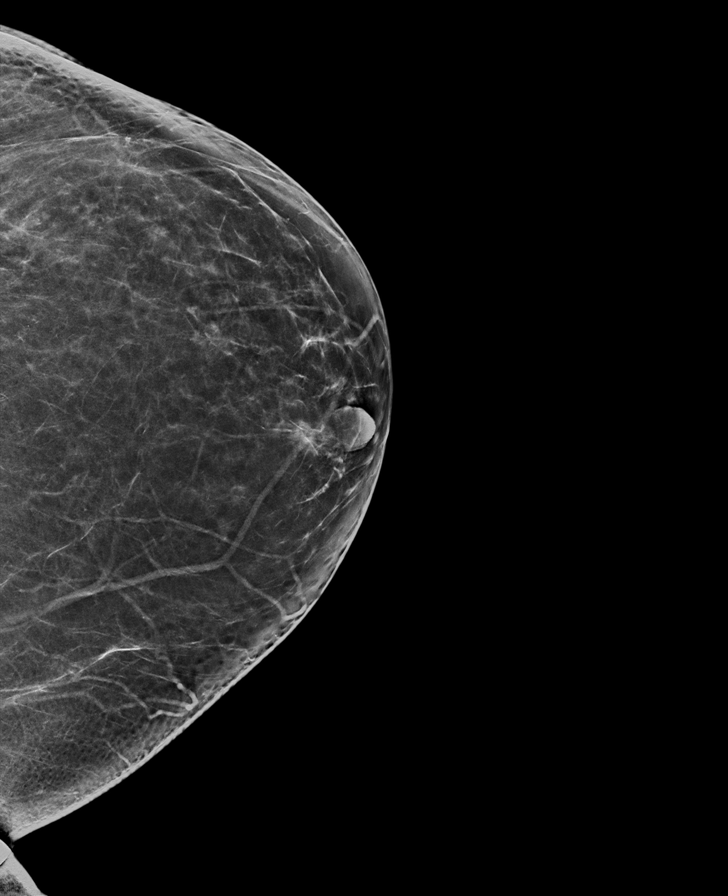

[R MLO synth-2D (1 of 2)]
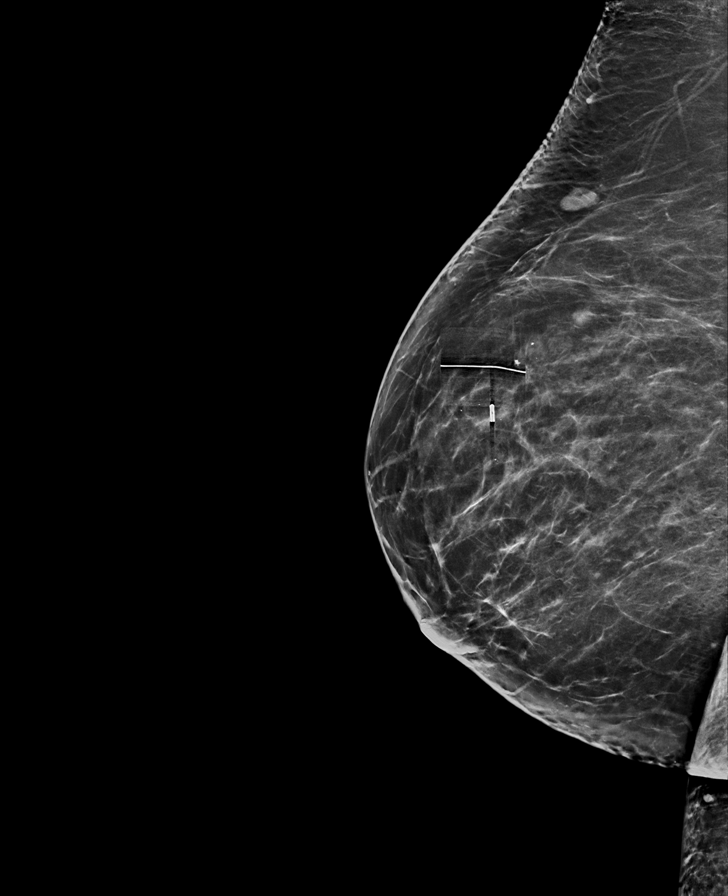

[L MLO synth-2D]
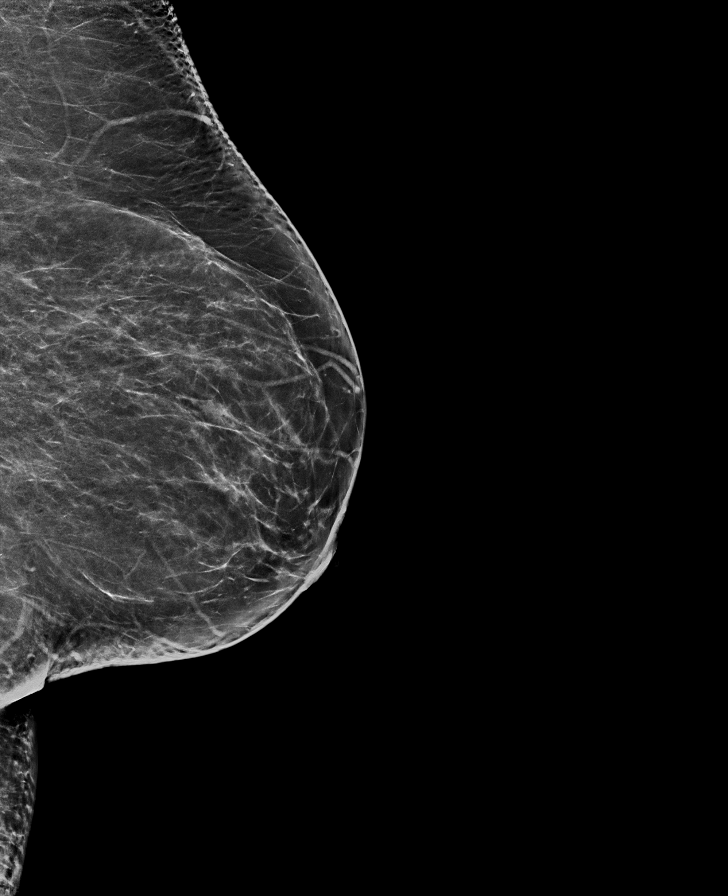

[R MLO synth-2D (2 of 2)]
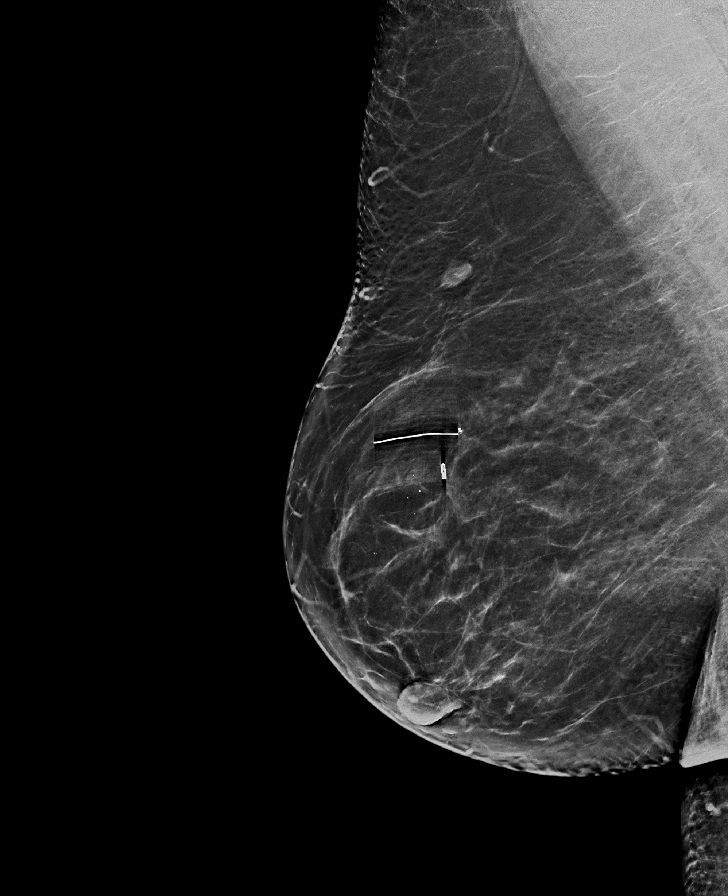

[R CC synth-2D (1 of 2)]
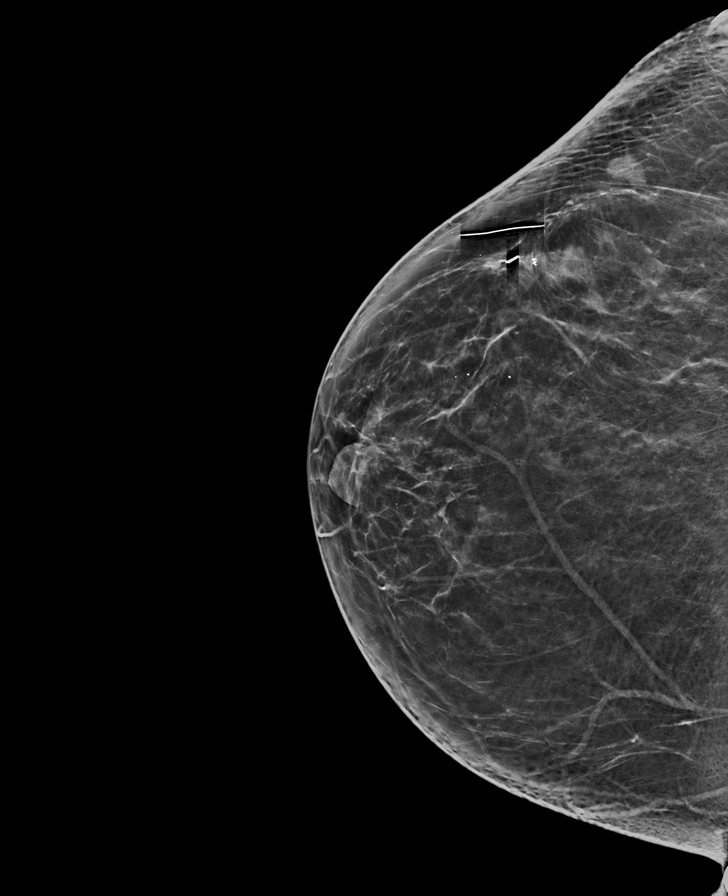

[R CC synth-2D (2 of 2)]
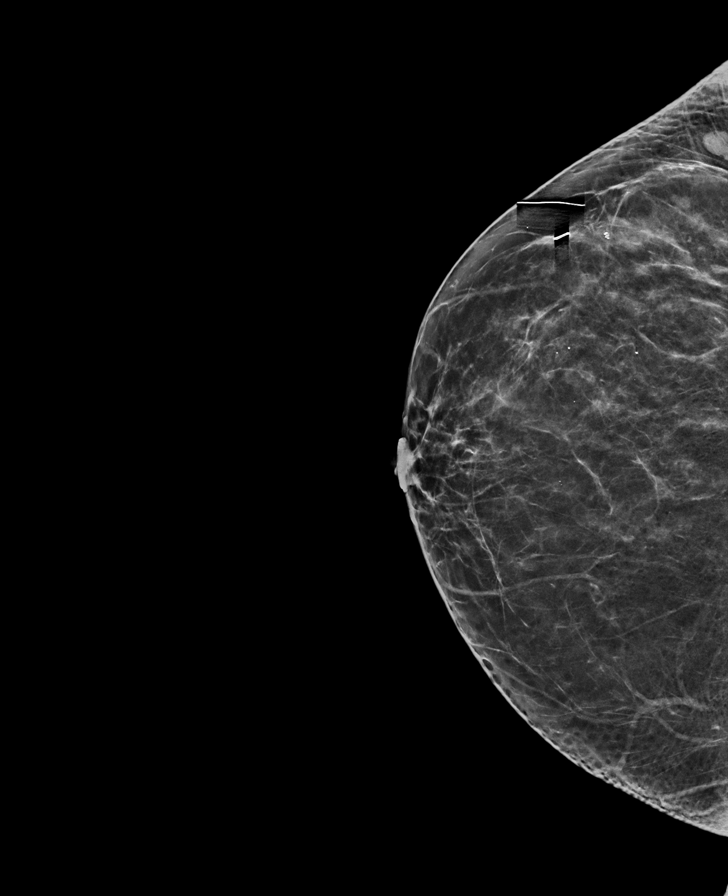

[L CC synth-2D (2 of 2)]
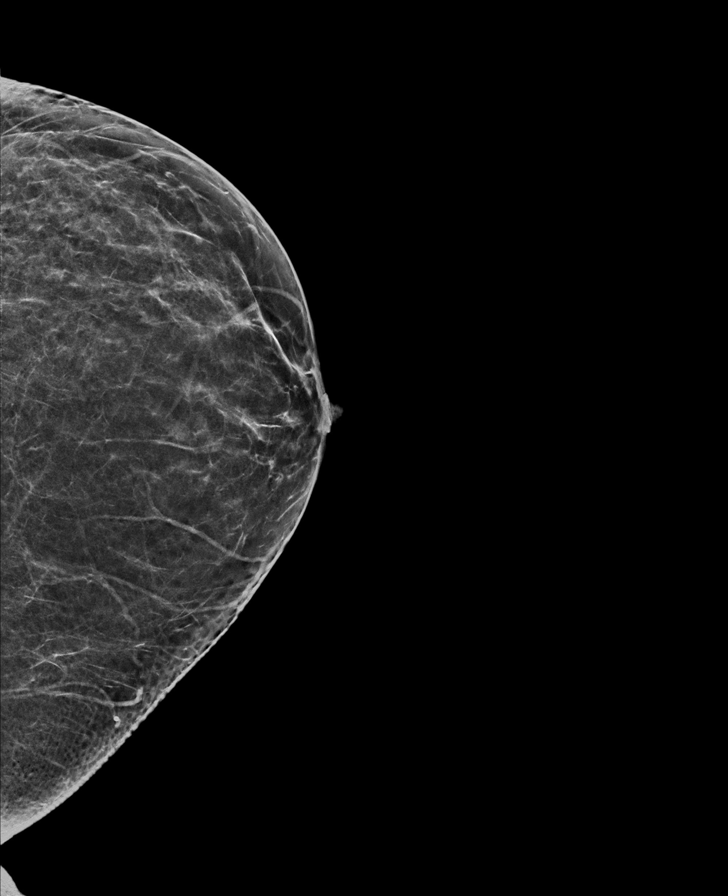

[R CC tomo · tomo slice 53/77.0]
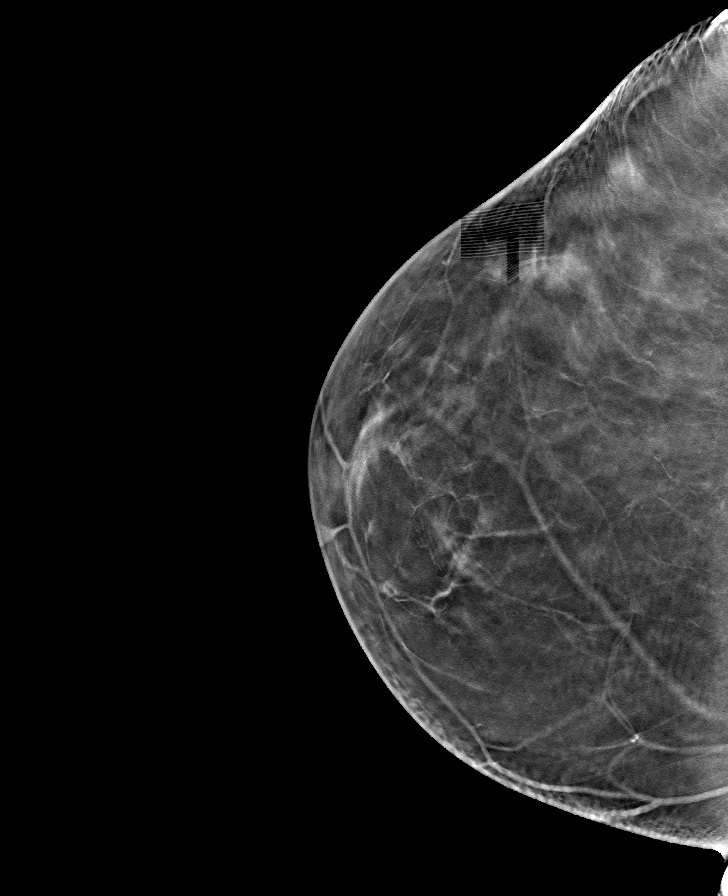

[8 of 40 positions shown; findings below may reference images not displayed]

ACR Breast Density Category b: There are scattered areas of
fibroglandular density.
FINDINGS: In the right breast, a possible mass warrants further evaluation.
This possible mass is seen within the outer RIGHT breast, CC [DATE]
slice 57 and MLO [DATE] slice 76. There are also postsurgical changes
within the adjacent upper-outer quadrant of the RIGHT breast.

In the left breast, no findings suspicious for malignancy.
IMPRESSION: Further evaluation is suggested for a possible mass in the right
breast.

RECOMMENDATION:
Diagnostic mammogram and possibly ultrasound of the right breast.
(Code:5Y-4-66O)

The patient will be contacted regarding the findings, and additional
imaging will be scheduled.

BI-RADS CATEGORY  0: Incomplete. Need additional imaging evaluation
and/or prior mammograms for comparison.

ADDENDUM:
Previous screening mammograms from 9494 and 4182 have now been
received. A possible mass within the RIGHT breast still warrants
further evaluation.
IMPRESSION: Further evaluation is suggested for a possible mass in
the RIGHT breast.

RECOMMENDATION: Diagnostic mammogram and possible ultrasound the
RIGHT breast.

BI-RADS CATEGORY 0: Incomplete. Needs additional imaging evaluation.

*** End of Addendum ***
ACR Breast Density Category b: There are scattered areas of
fibroglandular density.
FINDINGS: In the right breast, a possible mass warrants further evaluation.
This possible mass is seen within the outer RIGHT breast, CC [DATE]
slice 57 and MLO [DATE] slice 76. There are also postsurgical changes
within the adjacent upper-outer quadrant of the RIGHT breast.

In the left breast, no findings suspicious for malignancy.
IMPRESSION: Further evaluation is suggested for a possible mass in the right
breast.

RECOMMENDATION:
Diagnostic mammogram and possibly ultrasound of the right breast.
(Code:5Y-4-66O)

The patient will be contacted regarding the findings, and additional
imaging will be scheduled.

BI-RADS CATEGORY  0: Incomplete. Need additional imaging evaluation
and/or prior mammograms for comparison.

## 2023-02-15 DIAGNOSIS — F411 Generalized anxiety disorder: Secondary | ICD-10-CM | POA: Diagnosis not present

## 2023-03-02 ENCOUNTER — Telehealth: Payer: Self-pay | Admitting: Family Medicine

## 2023-03-02 NOTE — Telephone Encounter (Signed)
Called pt to reschedule cpe due to provider being out of office. Left vm. I rescheduled appt. If patient calls and that time and date doesn't work, please reschedule.

## 2023-03-04 ENCOUNTER — Other Ambulatory Visit: Payer: Self-pay | Admitting: Family Medicine

## 2023-03-04 DIAGNOSIS — F419 Anxiety disorder, unspecified: Secondary | ICD-10-CM

## 2023-03-05 NOTE — Telephone Encounter (Signed)
Requested Prescriptions  Pending Prescriptions Disp Refills   FLUoxetine (PROZAC) 20 MG capsule [Pharmacy Med Name: FLUOXETINE HCL 20 MG CAP] 90 capsule 0    Sig: TAKE 1 CAPSULE BY MOUTH ONCE DAILY     Psychiatry:  Antidepressants - SSRI Passed - 03/04/2023 10:33 AM      Passed - Valid encounter within last 6 months    Recent Outpatient Visits           5 months ago Class 3 obesity with alveolar hypoventilation, serious comorbidity, and body mass index (BMI) of 40.0 to 44.9 in adult [facility])   [place] Primary Care & Sports Medicine at [facility], Ocie Bob, MD   8 months ago Annual physical exam   [facility] Primary Care & Sports Medicine at [facility], Ocie Bob, MD   1 year ago Encounter for weight management   [place] Primary Care & Sports Medicine at [facility], Ocie Bob, MD   1 year ago Class 3 severe obesity without serious comorbidity with body mass index (BMI) of 40.0 to 44.9 in adult, unspecified obesity type [facility])   [place] Primary Care & Sports Medicine at [facility], Ocie Bob, MD   1 year ago Class 3 severe obesity without serious comorbidity with body mass index (BMI) of 40.0 to 44.9 in adult, unspecified obesity type [facility])    Primary Care & Sports Medicine at [facility], Ocie Bob, MD       Future Appointments             In 4 months Ashley Royalty, Ocie Bob, MD [facility] Primary Care & Sports Medicine at [facility], [facility]

## 2023-03-08 DIAGNOSIS — F411 Generalized anxiety disorder: Secondary | ICD-10-CM | POA: Diagnosis not present

## 2023-03-18 ENCOUNTER — Encounter: Payer: Self-pay | Admitting: Family Medicine

## 2023-04-12 DIAGNOSIS — F411 Generalized anxiety disorder: Secondary | ICD-10-CM | POA: Diagnosis not present

## 2023-04-13 ENCOUNTER — Ambulatory Visit (INDEPENDENT_AMBULATORY_CARE_PROVIDER_SITE_OTHER): Payer: BC Managed Care – PPO | Admitting: Adult Health

## 2023-04-13 ENCOUNTER — Encounter (INDEPENDENT_AMBULATORY_CARE_PROVIDER_SITE_OTHER): Payer: Self-pay | Admitting: Adult Health

## 2023-04-13 VITALS — BP 139/83 | HR 64 | Temp 98.1°F | Ht 66.5 in | Wt 266.0 lb

## 2023-04-13 DIAGNOSIS — Z6841 Body Mass Index (BMI) 40.0 and over, adult: Secondary | ICD-10-CM

## 2023-04-13 DIAGNOSIS — Z0289 Encounter for other administrative examinations: Secondary | ICD-10-CM

## 2023-04-13 DIAGNOSIS — Z0000 Encounter for general adult medical examination without abnormal findings: Secondary | ICD-10-CM

## 2023-04-13 NOTE — Progress Notes (Addendum)
 Office: [phone]  /  Fax: [phone]   Initial Visit  Holly Johnson was seen in clinic today to evaluate for obesity. She is interested in losing weight to improve overall health and reduce the risk of weight related complications. She presents today to review program treatment options, initial physical assessment, and evaluation.     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 She states: Adopt healthier eating patterns, Improve energy levels and physical activity, Improve quality of life, and Improve mobility  Weight history: She reports lifelong concern with weight.   When asked how has your weight affected you? She states: Contributed to medical problems, Contributed to orthopedic problems or mobility issues, Having fatigue, Having poor endurance, and Problems with eating patterns  Some associated conditions: None  Contributing factors: Family history of obesity, Moderate to high levels of stress, Reduced physical activity, Eating patterns, and Perimenopause  Weight promoting medications identified: Contraceptives or hormonal therapy  Current nutrition plan: None  Current level of physical activity: None  Current or previous pharmacotherapy: GLP-1 She was on [facility] last year.  She titrated up to max dose of 2.4mg .  She lost 25-30 lbs.  She regained all the weight (slowly) after d/c'g GLP-1 therapy.  Response to medication: Lost weight initially but was unable to sustain weight loss   Past medical history includes:   Past Medical History:  Diagnosis Date   Allergy    Anxiety    Basal cell carcinoma 2020   basal cell carcinoma   Biliary dyskinesia 07/25/2015   Chest pain    a. 02/2021 Cor CTA: Cor Ca2+ = 0. Nl cors. No significant noncardiac findings.   Morbid obesity (HCC)    Palpitations    PSVT (paroxysmal  supraventricular tachycardia) (HCC)    a. 12/2020 [facility]: Occas PSVT - fastest 162 x 7 beats, longest 9 beats @ 111.   Symptomatic PVC's (premature ventricular contractions)    a. 12/2020 [facility]: 5.3% PVC burden. Triggered events assoc w/ PVCs.     Objective:   BP 139/83   Pulse 64   Temp 98.1 F (36.7 C)   Ht 5' 6.5 (1.689 m)   Wt 266 lb (120.7 kg)   SpO2 98%   BMI 42.29 kg/m  She was weighed on the bioimpedance scale: Body mass index is 42.29 kg/m.  Peak Weight:272 , Body Fat%:49.1, Visceral Fat Rating:15, Weight trend over the last 12 months: Unchanged  General:  Alert, oriented and cooperative. Patient is in no acute distress.  Respiratory: Normal respiratory effort, no problems with respiration noted   Gait: able to ambulate independently  Mental Status: Normal mood and affect. Normal behavior. Normal judgment and thought content.   DIAGNOSTIC DATA REVIEWED:  BMET    Component Value Date/Time   NA 142 06/24/2022 0849   K 4.8 06/24/2022 0849   CL 104 06/24/2022 0849   CO2 25 06/24/2022 0849   GLUCOSE 86 06/24/2022 0849   BUN 12 06/24/2022 0849   CREATININE 0.92 06/24/2022 0849   CALCIUM 9.6 06/24/2022 0849   No results found for: HGBA1C No results found for: INSULIN  CBC    Component Value Date/Time   WBC 7.7 06/24/2022 0849   RBC 4.97 06/24/2022 0849   HGB 14.6 06/24/2022 0849   HCT 44.9 06/24/2022 0849   PLT 307 06/24/2022 0849  MCV 90 06/24/2022 0849   MCH 29.4 06/24/2022 0849   MCHC 32.5 06/24/2022 0849   RDW 12.5 06/24/2022 0849   Iron/TIBC/Ferritin/ %Sat    Component Value Date/Time   IRON 123 12/24/2020 0932   TIBC 326 12/24/2020 0932   FERRITIN 138 12/24/2020 0932   IRONPCTSAT 38 12/24/2020 0932   Lipid Panel     Component Value Date/Time   CHOL 207 (H) 06/24/2022 0849   TRIG 100 06/24/2022 0849   HDL 47 06/24/2022 0849   CHOLHDL 4.4 06/24/2022 0849   LDLCALC 142 (H) 06/24/2022 0849   Hepatic Function Panel     Component Value  Date/Time   PROT 6.8 06/24/2022 0849   ALBUMIN 4.6 06/24/2022 0849   AST 19 06/24/2022 0849   ALT 20 06/24/2022 0849   ALKPHOS 66 06/24/2022 0849   BILITOT 0.3 06/24/2022 0849      Component Value Date/Time   TSH 2.690 06/24/2022 0849     Assessment and Plan:   Healthcare maintenance  Morbid obesity (HCC), Starting BMI 42.3  ESTABLISH WITH HWW   Obesity Treatment / Action Plan:  Patient will work on garnering support from family and friends to begin weight loss journey. Will work on eliminating or reducing the presence of highly palatable, calorie dense foods in the home. Will complete provided nutritional and psychosocial assessment questionnaire before the next appointment. Will be scheduled for indirect calorimetry to determine resting energy expenditure in a fasting state.  This will allow us  to create a reduced calorie, high-protein meal plan to promote loss of fat mass while preserving muscle mass. Counseled on the health benefits of losing 5%-15% of total body weight. Was counseled on nutritional approaches to weight loss and benefits of reducing processed foods and consuming plant-based foods and high quality protein as part of nutritional weight management. Was counseled on pharmacotherapy and role as an adjunct in weight management.   Obesity Education Performed Today:  She was weighed on the bioimpedance scale and results were discussed and documented in the synopsis.  We discussed obesity as a disease and the importance of a more detailed evaluation of all the factors contributing to the disease.  We discussed the importance of long term lifestyle changes which include nutrition, exercise and behavioral modifications as well as the importance of customizing this to her specific health and social needs.  We discussed the benefits of reaching a healthier weight to alleviate the symptoms of existing conditions and reduce the risks of the biomechanical, metabolic and  psychological effects of obesity.  Holly Cramp Widmayer appears to be in the action stage of change and states they are ready to start intensive lifestyle modifications and behavioral modifications.  30 minutes was spent today on this visit including the above counseling, pre-visit chart review, and post-visit documentation.  Reviewed by clinician on day of visit: allergies, medications, problem list, medical history, surgical history, family history, social history, and previous encounter notes pertinent to obesity diagnosis.  Sarahann Horrell d. Rucha Wissinger, NP-C

## 2023-04-26 DIAGNOSIS — F411 Generalized anxiety disorder: Secondary | ICD-10-CM | POA: Diagnosis not present

## 2023-05-11 ENCOUNTER — Encounter (INDEPENDENT_AMBULATORY_CARE_PROVIDER_SITE_OTHER): Payer: Self-pay | Admitting: Family Medicine

## 2023-05-11 ENCOUNTER — Ambulatory Visit (INDEPENDENT_AMBULATORY_CARE_PROVIDER_SITE_OTHER): Payer: BC Managed Care – PPO | Admitting: Family Medicine

## 2023-05-11 VITALS — BP 158/81 | HR 55 | Temp 98.4°F | Ht 67.0 in | Wt 268.0 lb

## 2023-05-11 DIAGNOSIS — R5383 Other fatigue: Secondary | ICD-10-CM

## 2023-05-11 DIAGNOSIS — F39 Unspecified mood [affective] disorder: Secondary | ICD-10-CM | POA: Diagnosis not present

## 2023-05-11 DIAGNOSIS — Z6841 Body Mass Index (BMI) 40.0 and over, adult: Secondary | ICD-10-CM

## 2023-05-11 DIAGNOSIS — E039 Hypothyroidism, unspecified: Secondary | ICD-10-CM

## 2023-05-11 DIAGNOSIS — E559 Vitamin D deficiency, unspecified: Secondary | ICD-10-CM | POA: Diagnosis not present

## 2023-05-11 DIAGNOSIS — G4733 Obstructive sleep apnea (adult) (pediatric): Secondary | ICD-10-CM | POA: Diagnosis not present

## 2023-05-11 DIAGNOSIS — F5089 Other specified eating disorder: Secondary | ICD-10-CM

## 2023-05-11 DIAGNOSIS — R0602 Shortness of breath: Secondary | ICD-10-CM | POA: Diagnosis not present

## 2023-05-11 NOTE — Progress Notes (Signed)
Carlye Grippe, D.O.  ABFM, ABOM Specializing in Clinical Bariatric Medicine Office located at: [address]  [place], [place]  [address]   Bariatric Medicine Visit  Dear Holly Johnson, Holly Bob, MD   Thank you for referring Holly Johnson to our clinic today for evaluation.  We performed a consultation to discuss her options for treatment and educate the patient on her disease state.  The following note includes my evaluation and treatment recommendations.   Please do not hesitate to reach out to me directly if you have any further concerns.   Assessment and Plan:   FOR THE DISEASE OF OBESITY: Morbid obesity (HCC), Starting BMI 41.96 BMI 40.0-44.9, adult (HCC) - Current BMI 41.96 Assessment & Plan:  Recommended Dietary Goals Holly Johnson is currently in the action stage of change. As such, her goal is to start our weight management plan.  She has agreed to: keep a food journal with a target of  1450-1550 calories per day and 110++ g of protein using Category 2 plan as a guide with 6-8 ounces of protein at dinner   Behavioral Intervention We discussed the following Behavioral Modification Strategies today: increasing lean protein intake to established goals, decreasing simple carbohydrates , increasing vegetables, increasing lower glycemic fruits, increasing fiber rich foods, avoiding skipping meals, increasing water intake , work on tracking and journaling calories using tracking application, identifying sources and decreasing liquid calories, work on managing stress, creating time for self-care and relaxation, and using GPT or another AI platform for recipe ideas- searching "low calorie, low carb, high protein chicken recipes" etc  Additional resources provided today: Handout and personalized instruction on tracking and journaling using Apps or handwriting and using logs provided and handout on CAT 2 meal plan  Evidence-based interventions for health behavior change were utilized today  including the discussion of self monitoring techniques, problem-solving barriers and SMART goal Johnson techniques.    Pt will specifically work on: start her meal plan for next visit.    Recommended Physical Activity Goals Holly Johnson has been advised to work up to 150 minutes of moderate intensity aerobic activity a week and strengthening exercises 2-3 times per week for cardiovascular health, weight loss maintenance and preservation of muscle mass.   She has agreed to : maintain current level of activity.    Pharmacotherapy We discussed various medication options to help [facility] with her weight loss efforts and we both agreed to : continue with nutritional and behavioral strategies   FOR ASSOCIATED CONDITIONS ADDRESSED TODAY:  Fatigue Assessment & Plan: Holly Johnson does feel that her weight is causing her energy to be lower than it should be. Fatigue may be related to obesity, depression or many other causes. she does not appear to have any red flag symptoms and this appears to most likely be related to her current lifestyle habits and dietary intake.  Labs will be ordered and reviewed with her at their next office visit in two weeks.  OSA on CPAP Assessment & Plan: Per PCP note on 03/14/21, pt had a sleep study done and OSA was confirmed around that time. Pt is compliant with her CPAP, started using about 2 years ago. While on CPAP she has not had any more apneic episodes or snoring. Overall she is able to have restful feel while on CPAP.   Continue compliance with CPAP. Recommended pt try to get 7-9 hours of sleep per night. Will continue to monitor condition alongside PCP and specialists as it relates to her weight loss  journey.   Epworth sleepiness scale is 8  and appears to be within normal limits. Holly Johnson admits to daytime somnolence and denies waking up still tired. Patient has a history of symptoms of daytime fatigue and morning headache ("rare") -- but not since starting CPAP.  Holly Johnson generally gets  6-8  hours of sleep per night, and states that she has generally restful sleep. Snoring is not present since starting CPAP. Apneic episodes are not present since starting CPAP.   ECG: Performed and reviewed/ interpreted independently. Sinus bradycardia, rate 52 bpm; reassuring without any acute abnormalities, will continue to monitor for symptoms   Orders: - EKG 12-Lead - Vitamin B12 - CBC with Differential/Platelet - Comprehensive metabolic panel - Folate - Hemoglobin A1c - Insulin, random - Lipid Panel With LDL/HDL Ratio   Shortness of breath on exertion Assessment & Plan: Holly Johnson does feel that she gets out of breath more easily than she used to when she exercises and seems to be worsening over time with weight gain.  This has not gotten better recently since starting CPAP nightly. Holly Johnson denies shortness of breath at rest or orthopnea. Holly Johnson's shortness of breath appears to be obesity related and exercise induced, as they do not appear to have any "red flag" symptoms/concerns today.  Also, this condition appears to be related to a state of poor cardiovascular conditioning   Obtain labs today and will be reviewed with her at their next office visit in two weeks.  Indirect Calorimeter completed today to help guide our dietary regimen. It shows a VO2 of 290 and a REE of 2002.  Her calculated basal metabolic rate is 5784 thus her resting energy expenditure is better than expected.  Patient agreed to work on weight loss at this time.  As Holly Johnson progresses through our weight loss program, we will gradually increase exercise as tolerated to treat her current condition.   If Holly Johnson follows our recommendations and loses 5-10% of their weight without improvement of her shortness of breath or if at any time, symptoms become more concerning, they agree to urgently follow up with their PCP/ specialist for further consideration/ evaluation.   Holly Johnson verbalizes agreement  with this plan.   Mood disorder (HCC) - emotional eating Assessment & Plan: Her Food and Mood (modified PHQ-9) score was 9. Mood today is stable. Denies any SI/HI. Pt is established with behavioral health specialist, follows up every 2 weeks. She started Prozac in 2019/2020 for anxiety. Started on 10 mg but was recently increased to 20 mg in the last year or so.   Per pt New Packet: - Tends to eat when she is stressed, bored, or as a reward.  - Sometimes hides/sneaks foods as a "secret reward" - Denies any guilt or self-hate after overeating or making poor food choices.  - States her eating is "poorly controlled".  - States she tends to eat larger portions than the average person 2-3 times per week. - "might get seconds of something at home or eat the whole plate when eating out"  Patient will defer Dr.Barker referall at this time. States she will continue with her current therapist who she will discuss emotional eating with. Continue with her current medication regimen per her therapist. Follow up with her behavioral health specialists as directed by them, will monitor pt's condition alongside her therapist.    Vitamin D deficiency Assessment & Plan: Lab Results  Component Value Date   VD25OH 56.2 06/24/2022   VD25OH 26.9 (L) 12/24/2020   Pt  has previously been on ERGO in the past. Currently taking OTC Vitamin D 5,000 units once daily. Tolerating well, no SE. States she is now more compliant with her supplementation now that she has a pill Dance movement psychotherapist.   Reviewed ideal levels of 50-70 with pt. Continue with her current supplementation regimen. Will continue to monitor her condition.   Orders: - Recheck Vitamin D    Acquired hypothyroidism Assessment & Plan: Lab Results  Component Value Date   TSH 2.690 06/24/2022   Pt diagnosed about 15 years ago. Currently on Synthroid 88 mcg, managed by her PCP. Tolerating well, no SE. No acute concerns.   Continue with current medication  regimen. Advised to follow up with PCP as directed by them for management of condition. Will monitor condition alongside PCP.   Orders: - T4, free - T3 - TSH   FOLLOW UP:   Follow up in 2 weeks. She was informed of the importance of frequent follow up visits to maximize her success with intensive lifestyle modifications for her multiple health conditions.  Holly Johnson is aware that we will review all of her lab results at our next visit.  She is aware that if anything is critical/ life threatening with the results, we will be contacting her via MyChart prior to the office visit to discuss management.    Chief Complaint:   OBESITY Aianna Fahs (MR# [id number]) is a pleasant 48 y.o. female who presents for evaluation and treatment of obesity and related comorbidities. Current BMI is Body mass index is 41.66 kg/m. Tamryn Selena Batten Tarleton has been struggling with her weight for many years and has been unsuccessful in either losing weight, maintaining weight loss, or reaching her healthy weight goal.  Holly Johnson is currently in the action stage of change and ready to dedicate time achieving and maintaining a healthier weight. Patrisia Selena Batten Rilling is interested in becoming our patient and working on intensive lifestyle modifications including (but not limited to) diet and exercise for weight loss.  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 Patient is married to Holly Johnson  and has 2 children (ages 78 and 77). She lives with her husband and 2 kids.   She is not exercising.   Her goal weight is 200 lbs in a year or more (lose about 66 lbs).   Reasons for weight gain include emotional eating and traumas in her life.  Past diets: Has done low carb, Weight Watchers (on/off, lost 20 lbs), and worked with a Psychologist, educational in group fitness diet (macros based). With the trainer she lost 60-70 lbs and kept off for several months, gained back over 2  years.   Is eating out the home 5+ times per week.  Craves chips/potatoes and sweets, mostly when driving from work.  Snacks on jalapeno Cheetos, BBQ chips, candy bars, and Little Debbie cake snacks.   Commonly skips lunch and sometimes breakfast.   Drinks 2-3 cans of Diet Mountain Dew per day but otherwise just has coffee with milk and flavorings.   Worst food habit: "All of it"   BP is elevated today, which pt attributes to recent high stress due to her sister-in-law passing late last week. No prior hx of HTN per pt.    Subjective:   This is the patient's first visit at [facility].  The patient's NEW PATIENT PACKET that they filled out prior to today's office visit was reviewed at  length and information from that paperwork was included within the following office visit note.    Included in the packet: current and past health history, medications, allergies, ROS, gynecologic history (women only), surgical history, family history, social history, weight history, weight loss surgery history (for those that have had weight loss surgery), nutritional evaluation, mood and food questionnaire along with a depression screening (PHQ9) on all patients, an Epworth questionnaire, sleep habits questionnaire, patient life and health improvement goals questionnaire. These will all be scanned into the patient's chart under the "media" tab.   Review of Systems: Please refer to new patient packet scanned into media. Pertinent positives were addressed with patient today.  Reviewed by clinician on day of visit: allergies, medications, problem list, medical history, surgical history, family history, social history, and previous encounter notes.  During the visit, I independently reviewed the patient's EKG, bioimpedance scale results, and indirect calorimeter results. I used this information to tailor a meal plan for the patient that will help Holly Johnson to lose weight and will improve  her obesity-related conditions going forward.  I performed a medically necessary appropriate examination and/or evaluation. I discussed the assessment and treatment plan with the patient. The patient was provided an opportunity to ask questions and all were answered. The patient agreed with the plan and demonstrated an understanding of the instructions. Labs were ordered today (unless patient declined them) and will be reviewed with the patient at our next visit unless more critical results need to be addressed immediately. Clinical information was updated and documented in the EMR.    Objective:   PHYSICAL EXAM: Blood pressure (!) 158/81, pulse (!) 55, temperature 98.4 F (36.9 C), height 5\' 7"  (1.702 m), SpO2 98%. Body mass index is 41.66 kg/m.  General: Well Developed, well nourished, and in no acute distress.  HEENT: Normocephalic, atraumatic; EOMI, sclerae are anicteric. Skin: Warm and dry, good turgor Chest:  Normal excursion, shape, no gross ABN Respiratory: No conversational dyspnea; speaking in full sentences NeuroM-Sk:  Normal gross ROM * 4 extremities  Psych: A and O *3, insight adequate, mood- full   Anthropometric Measurements Height: 5\' 7"  (1.702 m)   No data recorded Other Clinical Data Fasting: yes Labs: yes Today's Visit #: 1 Starting Date: 05/11/23    DIAGNOSTIC DATA REVIEWED:  BMET    Component Value Date/Time   NA 142 06/24/2022 0849   K 4.8 06/24/2022 0849   CL 104 06/24/2022 0849   CO2 25 06/24/2022 0849   GLUCOSE 86 06/24/2022 0849   BUN 12 06/24/2022 0849   CREATININE 0.92 06/24/2022 0849   CALCIUM 9.6 06/24/2022 0849   No results found for: "HGBA1C" No results found for: "INSULIN" Lab Results  Component Value Date   TSH 2.690 06/24/2022   CBC    Component Value Date/Time   WBC 7.7 06/24/2022 0849   RBC 4.97 06/24/2022 0849   HGB 14.6 06/24/2022 0849   HCT 44.9 06/24/2022 0849   PLT 307 06/24/2022 0849   MCV 90 06/24/2022 0849   MCH  29.4 06/24/2022 0849   MCHC 32.5 06/24/2022 0849   RDW 12.5 06/24/2022 0849   Iron Studies    Component Value Date/Time   IRON 123 12/24/2020 0932   TIBC 326 12/24/2020 0932   FERRITIN 138 12/24/2020 0932   IRONPCTSAT 38 12/24/2020 0932   Lipid Panel     Component Value Date/Time   CHOL 207 (H) 06/24/2022 0849   TRIG 100 06/24/2022 0849   HDL 47  06/24/2022 0849   CHOLHDL 4.4 06/24/2022 0849   LDLCALC 142 (H) 06/24/2022 0849   Hepatic Function Panel     Component Value Date/Time   PROT 6.8 06/24/2022 0849   ALBUMIN 4.6 06/24/2022 0849   AST 19 06/24/2022 0849   ALT 20 06/24/2022 0849   ALKPHOS 66 06/24/2022 0849   BILITOT 0.3 06/24/2022 0849      Component Value Date/Time   TSH 2.690 06/24/2022 0849   Nutritional Lab Results  Component Value Date   VD25OH 56.2 06/24/2022   VD25OH 26.9 (L) 12/24/2020    Attestation Statements:   Burnett Sheng, acting as a medical scribe for Thomasene Lot, DO., have compiled all relevant documentation for today's office visit on behalf of Thomasene Lot, DO, while in the presence of [employer], DO.  Reviewed by clinician on day of visit: allergies, medications, problem list, medical history, surgical history, family history, social history, and previous encounter notes pertinent to patient's obesity diagnosis.  I have spent 55 minutes in the care of the patient today including: preparing to see patient (e.g. review and interpretation of tests, old notes ), obtaining and/or reviewing separately obtained history, performing a medically appropriate examination or evaluation, counseling and educating the patient, ordering medications, test or procedures, documenting clinical information in the electronic or other health care record, and independently interpreting results and communicating results to the patient, family, or caregiver   I have reviewed the above documentation for accuracy and completeness, and I agree with the  above. Carlye Grippe, D.O.  The 21st Century Cures Act was signed into law in 2016 which includes the topic of electronic health records.  This provides immediate access to information in MyChart.  This includes consultation notes, operative notes, office notes, lab results and pathology reports.  If you have any questions about what you read please let us know at your next visit so we can discuss your concerns and take corrective action if need be.  We are right here with you.

## 2023-05-12 DIAGNOSIS — G4733 Obstructive sleep apnea (adult) (pediatric): Secondary | ICD-10-CM | POA: Diagnosis not present

## 2023-05-12 LAB — LIPID PANEL WITH LDL/HDL RATIO
Cholesterol, Total: 193 mg/dL (ref 100–199)
HDL: 44 mg/dL (ref >39)
LDL Chol Calc (NIH): 130 mg/dL — ABNORMAL HIGH (ref 0–99)
LDL/HDL Ratio: 3 {ratio} (ref 0.0–3.2)
Triglycerides: 104 mg/dL (ref 0–149)
VLDL Cholesterol Cal: 19 mg/dL (ref 5–40)

## 2023-05-12 LAB — T4, FREE: Free T4: 1.2 ng/dL (ref 0.82–1.77)

## 2023-05-12 LAB — CBC WITH DIFFERENTIAL/PLATELET
Basophils Absolute: 0 10*3/uL (ref 0.0–0.2)
Basos: 1 %
EOS (ABSOLUTE): 0.5 10*3/uL — ABNORMAL HIGH (ref 0.0–0.4)
Eos: 7 %
Hematocrit: 42.3 % (ref 34.0–46.6)
Hemoglobin: 14 g/dL (ref 11.1–15.9)
Immature Grans (Abs): 0 10*3/uL (ref 0.0–0.1)
Immature Granulocytes: 0 %
Lymphocytes Absolute: 2.5 10*3/uL (ref 0.7–3.1)
Lymphs: 38 %
MCH: 30.2 pg (ref 26.6–33.0)
MCHC: 33.1 g/dL (ref 31.5–35.7)
MCV: 91 fL (ref 79–97)
Monocytes Absolute: 0.4 10*3/uL (ref 0.1–0.9)
Monocytes: 6 %
Neutrophils Absolute: 3.2 10*3/uL (ref 1.4–7.0)
Neutrophils: 48 %
Platelets: 249 10*3/uL (ref 150–450)
RBC: 4.64 x10E6/uL (ref 3.77–5.28)
RDW: 12.7 % (ref 11.7–15.4)
WBC: 6.6 10*3/uL (ref 3.4–10.8)

## 2023-05-12 LAB — HEMOGLOBIN A1C
Est. average glucose Bld gHb Est-mCnc: 123 mg/dL
Hgb A1c MFr Bld: 5.9 % — ABNORMAL HIGH (ref 4.8–5.6)

## 2023-05-12 LAB — COMPREHENSIVE METABOLIC PANEL
ALT: 21 [IU]/L (ref 0–32)
AST: 16 [IU]/L (ref 0–40)
Albumin: 4.2 g/dL (ref 3.9–4.9)
Alkaline Phosphatase: 58 [IU]/L (ref 44–121)
BUN/Creatinine Ratio: 18 (ref 9–23)
BUN: 15 mg/dL (ref 6–24)
Bilirubin Total: 0.4 mg/dL (ref 0.0–1.2)
CO2: 23 mmol/L (ref 20–29)
Calcium: 8.8 mg/dL (ref 8.7–10.2)
Chloride: 104 mmol/L (ref 96–106)
Creatinine, Ser: 0.85 mg/dL (ref 0.57–1.00)
Globulin, Total: 2.4 g/dL (ref 1.5–4.5)
Glucose: 88 mg/dL (ref 70–99)
Potassium: 4.4 mmol/L (ref 3.5–5.2)
Sodium: 140 mmol/L (ref 134–144)
Total Protein: 6.6 g/dL (ref 6.0–8.5)
eGFR: 85 mL/min/{1.73_m2} (ref >59)

## 2023-05-12 LAB — TSH: TSH: 2.91 u[IU]/mL (ref 0.450–4.500)

## 2023-05-12 LAB — VITAMIN B12: Vitamin B-12: 289 pg/mL (ref 232–1245)

## 2023-05-12 LAB — T3: T3, Total: 88 ng/dL (ref 71–180)

## 2023-05-12 LAB — INSULIN, RANDOM: INSULIN: 7.1 u[IU]/mL (ref 2.6–24.9)

## 2023-05-12 LAB — VITAMIN D 25 HYDROXY (VIT D DEFICIENCY, FRACTURES): Vit D, 25-Hydroxy: 67.7 ng/mL (ref 30.0–100.0)

## 2023-05-12 LAB — FOLATE: Folate: 8.4 ng/mL (ref >3.0)

## 2023-05-13 DIAGNOSIS — F411 Generalized anxiety disorder: Secondary | ICD-10-CM | POA: Diagnosis not present

## 2023-05-25 ENCOUNTER — Encounter (INDEPENDENT_AMBULATORY_CARE_PROVIDER_SITE_OTHER): Payer: Self-pay | Admitting: Family Medicine

## 2023-05-25 ENCOUNTER — Ambulatory Visit (INDEPENDENT_AMBULATORY_CARE_PROVIDER_SITE_OTHER): Payer: BC Managed Care – PPO | Admitting: Family Medicine

## 2023-05-25 VITALS — BP 137/77 | HR 59 | Temp 98.1°F | Ht 67.0 in | Wt 260.0 lb

## 2023-05-25 DIAGNOSIS — E039 Hypothyroidism, unspecified: Secondary | ICD-10-CM

## 2023-05-25 DIAGNOSIS — E7849 Other hyperlipidemia: Secondary | ICD-10-CM | POA: Diagnosis not present

## 2023-05-25 DIAGNOSIS — Z6841 Body Mass Index (BMI) 40.0 and over, adult: Secondary | ICD-10-CM

## 2023-05-25 DIAGNOSIS — R7303 Prediabetes: Secondary | ICD-10-CM

## 2023-05-25 DIAGNOSIS — E559 Vitamin D deficiency, unspecified: Secondary | ICD-10-CM

## 2023-05-25 DIAGNOSIS — E538 Deficiency of other specified B group vitamins: Secondary | ICD-10-CM | POA: Diagnosis not present

## 2023-05-25 MED ORDER — CYANOCOBALAMIN 500 MCG PO TABS: 500.0000 ug | ORAL_TABLET | Freq: Every day | ORAL | Status: AC

## 2023-05-25 NOTE — Patient Instructions (Signed)
 The 10-year ASCVD risk score (Arnett DK, et al., 2019) is: 1.4%   Values used to calculate the score:     Age: 48 years     Sex: Female     Is Non-Hispanic African American: No     Diabetic: No     Tobacco smoker: No     Systolic Blood Pressure: 137 mmHg     Is BP treated: No     HDL Cholesterol: 44 mg/dL     Total Cholesterol: 193 mg/dL

## 2023-05-25 NOTE — Progress Notes (Signed)
 Carlye Grippe, D.O.  ABFM, ABOM Clinical Bariatric Medicine Physician  Office located at: [address] [place], [place]  [address]   Assessment and Plan:   FOR THE DISEASE OF OBESITY: BMI 40.0-44.9, adult (HCC) - Current BMI 40.71 Morbid obesity (HCC), Starting BMI 41.96 date 05/11/23 Assessment & Plan: Since last office visit on 05/11/2023 patient's  Muscle mass has decreased by 1 lb. Fat mass has decreased by 6.8 lb. Total body water has decreased by 1.2 lb.  Counseling done on how various foods will affect these numbers and how to maximize success  Total lbs lost to date: 8 lbs  Total weight loss percentage to date: 2.99%    Recommended Dietary Goals Holly Johnson is currently in the action stage of change. As such, her goal is to continue weight management plan.  She has agreed to: continue current plan   Behavioral Intervention We discussed the following today: eating out strategies,  increasing lower glycemic fruits and increasing water intake   Additional resources provided today:  handout on food journaling log & handout on I.R and Pre-DM  Evidence-based interventions for health behavior change were utilized today including the discussion of self monitoring techniques, problem-solving barriers and SMART goal setting techniques.   Regarding patient's less desirable eating habits and patterns, we employed the technique of small changes.   Pt will specifically work on: drinking 100 ounces of water daily   Recommended Physical Activity Goals Holly Johnson has been advised to work up to 150 minutes of moderate intensity aerobic activity a week and strengthening exercises 2-3 times per week for cardiovascular health, weight loss maintenance and preservation of muscle mass.   She has agreed to : Continue current level of physical activity    Pharmacotherapy We both agreed to : continue with nutritional and behavioral strategies   FOR ASSOCIATED CONDITIONS ADDRESSED  TODAY:  Prediabetes - new onset Assessment & Plan: Lab Results  Component Value Date   HGBA1C 5.9 (H) 05/11/2023   INSULIN 7.1 05/11/2023    Lab Results  Component Value Date   CREATININE 0.85 05/11/2023   BUN 15 05/11/2023   NA 140 05/11/2023   K 4.4 05/11/2023   CL 104 05/11/2023   CO2 23 05/11/2023      Component Value Date/Time   PROT 6.6 05/11/2023 1109   ALBUMIN 4.2 05/11/2023 1109   AST 16 05/11/2023 1109   ALT 21 05/11/2023 1109   ALKPHOS 58 05/11/2023 1109   BILITOT 0.4 05/11/2023 1109   Lab Results  Component Value Date   WBC 6.6 05/11/2023   HGB 14.0 05/11/2023   HCT 42.3 05/11/2023   MCV 91 05/11/2023   PLT 249 05/11/2023   Her Hemoglobin A1c is consistent with the prediabetic range. Her fasting insulin is mildly elevated at 7.1. Kidney function, electrolytes, liver enzymes, and blood counts WNL.   Patient aware of disease state and risk of progression. This may contribute to abnormal cravings, fatigue and diabetic complications without having diabetes. Educated patient that having adequate amounts of protein with each meal is important for increasing muscle mass, stabilizing sugars, controlling hunger and cravings, and improving thermogenesis. Continue working on nutrition plan to decrease simple carbohydrates, increase lean proteins and exercise to promote weight loss and improve glycemic control and prevent progression to T2DM.  Will consider initiating Metformin in the future if needed.    Other hyperlipidemia Assessment & Plan: Lab Results  Component Value Date   CHOL 193 05/11/2023   HDL 44  05/11/2023   LDLCALC 130 (H) 05/11/2023   TRIG 104 05/11/2023   CHOLHDL 4.4 06/24/2022   The 10-year ASCVD risk score (Arnett DK, et al., 2019) is: 1.4%   Values used to calculate the score:     Age: 48 years     Sex: Female     Is Non-Hispanic African American: No     Diabetic: No     Tobacco smoker: No     Systolic Blood Pressure: 137 mmHg     Is BP  treated: No     HDL Cholesterol: 44 mg/dL     Total Cholesterol: 193 mg/dL  Lipid panel WNL w/ exception of LDL. No concerns with her ASCVD risk score. Pt advised to reduce saturated and trans fats in diet. Advance exercise as able to eventual goal of a minimum of 150+ min wk plus 2 days/ week of resistance or strength training.     Acquired hypothyroidism Assessment & Plan: Lab Results  Component Value Date   TSH 2.910 05/11/2023   T4, free 1.20 05/11/2023   T3TOTAL 88 05/11/2023    Condition is well-controlled on Synthroid 88 mcg. No concerns w/ Thyroid Panel results. Continue medication per PCP. Will continue to monitor levels.     B12 deficiency - new onset Assessment & Plan: Lab Results  Component Value Date   VITAMINB12 289 05/11/2023   FOLATE 8.4 05/11/2023    No concerns with Folate level. However b12 level is 289, not at goal of over 500. Shared decision making: Start OTC B12 500 mcg daily. Continue to focus on b12 rich foods. Recheck levels in 3 months.    Relevant Orders:  -     Cyanocobalamin; Take 1 tablet (500 mcg total) by mouth daily.   Vitamin D deficiency Assessment & Plan: Lab Results  Component Value Date   VD25OH 67.7 05/11/2023   VD25OH 56.2 06/24/2022   VD25OH 26.9 (L) 12/24/2020   Pt is doing well on OTC Vitamin D 5,000 units daily. Her Vitamin D level is at goal. Continue regimen. Recheck periodically.    FOLLOW UP:   Return 06/15/2023 with William Hamburger NP. She was informed of the importance of frequent follow up visits to maximize her success with intensive lifestyle modifications for her multiple health conditions.  Subjective:   Chief complaint: Obesity Holly Johnson is here to discuss her progress with her obesity treatment plan. She is keeping a food journal with a target of 1450-1550 calories per day and 110++ g of protein using the Category 2 plan as a guide with 6-8 ounces of protein at dinner and states she  followed her eating plan  approximately 75-90% for a week.  She states she is not exercising.  Interval History:  Holly Johnson is here today for her first follow-up office visit since starting the program with [place]. Patient is off to a good start and has lost 6 lbs. Per pt's journaling app, she averaged anywhere between 605 - 1504 calories and 56 - 128 grams protein. She denies having hunger/cravings and has been avoiding sweets. Drinks around 50-60 ounces of water daily.   Pharmacotherapy for weight loss: She is not currently taking medications  for medical weight loss.    Review of Systems:  Pertinent positives were addressed with patient today.  Reviewed by clinician on day of visit: allergies, medications, problem list, medical history, surgical history, family history, social history, and previous encounter notes.  Weight Summary and Biometrics   Weight Lost Since Last  Visit: 8lb  Weight Gained Since Last Visit: 0   Vitals Temp: 98.1 F (36.7 C) BP: 137/77 Pulse Rate: (!) 59 SpO2: 97 %   Anthropometric Measurements Height: 5\' 7"  (1.702 m) Weight: 260 lb (117.9 kg) BMI (Calculated): 40.71 Weight at Last Visit: 268lb Weight Lost Since Last Visit: 8lb Weight Gained Since Last Visit: 0 Starting Weight: 268lb Total Weight Loss (lbs): 8 lb (3.629 kg) Peak Weight: 272lb   Body Composition  Body Fat %: 47.9 % Fat Mass (lbs): 125 lbs Muscle Mass (lbs): 129 lbs Total Body Water (lbs): 96 lbs Visceral Fat Rating : 14   Other Clinical Data Fasting: yes Labs: no Today's Visit #: 2 Starting Date: 05/11/23   Objective:   PHYSICAL EXAM:  Blood pressure 137/77, pulse (!) 59, temperature 98.1 F (36.7 C), height 5\' 7"  (1.702 m), weight 260 lb (117.9 kg), SpO2 97%. Body mass index is 40.72 kg/m.  General: she is overweight, cooperative and in no acute distress.   HEENT: EOMI, sclerae are anicteric. Lungs: Normal breathing effort, no conversational dyspnea. M-Sk:  Normal gross ROM * 4  extremities  PSYCH: Has normal mood, affect and thought process. Neurologic: No gross sensory or motor deficits. Well developed, A and O * 3  DIAGNOSTIC DATA REVIEWED:  BMET    Component Value Date/Time   NA 140 05/11/2023 1109   K 4.4 05/11/2023 1109   CL 104 05/11/2023 1109   CO2 23 05/11/2023 1109   GLUCOSE 88 05/11/2023 1109   BUN 15 05/11/2023 1109   CREATININE 0.85 05/11/2023 1109   CALCIUM 8.8 05/11/2023 1109   Lab Results  Component Value Date   HGBA1C 5.9 (H) 05/11/2023   Lab Results  Component Value Date   INSULIN 7.1 05/11/2023   Lab Results  Component Value Date   TSH 2.910 05/11/2023   CBC    Component Value Date/Time   WBC 6.6 05/11/2023 1109   RBC 4.64 05/11/2023 1109   HGB 14.0 05/11/2023 1109   HCT 42.3 05/11/2023 1109   PLT 249 05/11/2023 1109   MCV 91 05/11/2023 1109   MCH 30.2 05/11/2023 1109   MCHC 33.1 05/11/2023 1109   RDW 12.7 05/11/2023 1109   Iron Studies    Component Value Date/Time   IRON 123 12/24/2020 0932   TIBC 326 12/24/2020 0932   FERRITIN 138 12/24/2020 0932   IRONPCTSAT 38 12/24/2020 0932   Lipid Panel     Component Value Date/Time   CHOL 193 05/11/2023 1109   TRIG 104 05/11/2023 1109   HDL 44 05/11/2023 1109   CHOLHDL 4.4 06/24/2022 0849   LDLCALC 130 (H) 05/11/2023 1109   Hepatic Function Panel     Component Value Date/Time   PROT 6.6 05/11/2023 1109   ALBUMIN 4.2 05/11/2023 1109   AST 16 05/11/2023 1109   ALT 21 05/11/2023 1109   ALKPHOS 58 05/11/2023 1109   BILITOT 0.4 05/11/2023 1109      Component Value Date/Time   TSH 2.910 05/11/2023 1109   Nutritional Lab Results  Component Value Date   VD25OH 67.7 05/11/2023   VD25OH 56.2 06/24/2022   VD25OH 26.9 (L) 12/24/2020    Attestations:   Reviewed by clinician on day of visit: allergies, medications, problem list, medical history, surgical history, family history, social history, and previous encounter notes pertinent to patient's obesity  diagnosis.   I have spent 41 minutes in the care of the patient today including: preparing to see patient (e.g. review and interpretation of  tests, old notes ), obtaining and/or reviewing separately obtained history, performing a medically appropriate examination or evaluation, counseling and educating the patient, ordering medications, test or procedures, documenting clinical information in the electronic or other health care record, and independently interpreting results and communicating results to the patient, family, or caregiver   I, Special Randolm Idol, acting as a Stage manager for Thomasene Lot, DO., have compiled all relevant documentation for today's office visit on behalf of Thomasene Lot, DO, while in the presence of [employer], DO.  I have reviewed the above documentation for accuracy and completeness, and I agree with the above. Carlye Grippe, D.O.  The 21st Century Cures Act was signed into law in 2016 which includes the topic of electronic health records.  This provides immediate access to information in MyChart.  This includes consultation notes, operative notes, office notes, lab results and pathology reports.  If you have any questions about what you read please let us know at your next visit so we can discuss your concerns and take corrective action if need be.  We are right here with you.

## 2023-05-27 DIAGNOSIS — F411 Generalized anxiety disorder: Secondary | ICD-10-CM | POA: Diagnosis not present

## 2023-06-07 DIAGNOSIS — F411 Generalized anxiety disorder: Secondary | ICD-10-CM | POA: Diagnosis not present

## 2023-06-09 ENCOUNTER — Other Ambulatory Visit: Payer: Self-pay

## 2023-06-09 ENCOUNTER — Encounter: Payer: Self-pay | Admitting: Family Medicine

## 2023-06-09 ENCOUNTER — Other Ambulatory Visit: Payer: Self-pay | Admitting: Family Medicine

## 2023-06-09 DIAGNOSIS — N63 Unspecified lump in unspecified breast: Secondary | ICD-10-CM

## 2023-06-09 DIAGNOSIS — G4733 Obstructive sleep apnea (adult) (pediatric): Secondary | ICD-10-CM | POA: Diagnosis not present

## 2023-06-09 DIAGNOSIS — Z1231 Encounter for screening mammogram for malignant neoplasm of breast: Secondary | ICD-10-CM

## 2023-06-09 NOTE — Telephone Encounter (Signed)
 Please review.  KP

## 2023-06-15 ENCOUNTER — Ambulatory Visit (INDEPENDENT_AMBULATORY_CARE_PROVIDER_SITE_OTHER): Payer: BC Managed Care – PPO | Admitting: Adult Health

## 2023-06-15 ENCOUNTER — Encounter (INDEPENDENT_AMBULATORY_CARE_PROVIDER_SITE_OTHER): Payer: Self-pay | Admitting: Adult Health

## 2023-06-15 VITALS — BP 136/82 | HR 65 | Temp 98.3°F | Ht 67.0 in | Wt 256.0 lb

## 2023-06-15 DIAGNOSIS — R7303 Prediabetes: Secondary | ICD-10-CM

## 2023-06-15 DIAGNOSIS — F5089 Other specified eating disorder: Secondary | ICD-10-CM | POA: Diagnosis not present

## 2023-06-15 DIAGNOSIS — F39 Unspecified mood [affective] disorder: Secondary | ICD-10-CM | POA: Diagnosis not present

## 2023-06-15 DIAGNOSIS — E538 Deficiency of other specified B group vitamins: Secondary | ICD-10-CM | POA: Diagnosis not present

## 2023-06-15 DIAGNOSIS — Z6841 Body Mass Index (BMI) 40.0 and over, adult: Secondary | ICD-10-CM

## 2023-06-15 NOTE — Progress Notes (Signed)
 WEIGHT SUMMARY AND BIOMETRICS  Vitals Temp: 98.3 F (36.8 C) BP: 136/82 Pulse Rate: 65 SpO2: 96 %   Anthropometric Measurements Height: 5\' 7"  (1.702 m) Weight: 256 lb (116.1 kg) BMI (Calculated): 40.09 Weight at Last Visit: 260 lb Weight Lost Since Last Visit: 4 lb Weight Gained Since Last Visit: 0 lb Starting Weight: 268 lb12 lb Peak Weight: 272 lb   Body Composition  Body Fat %: 46.6 % Fat Mass (lbs): 119.4 lbs Muscle Mass (lbs): 130 lbs Total Body Water (lbs): 93 lbs Visceral Fat Rating : 13   Other Clinical Data RMR: 2002 Fasting: yes Labs: no Today's Visit #: 3 Starting Date: 05/11/23    Chief Complaint:   OBESITY Holly Johnson is here to discuss her progress with her obesity treatment plan.  She is on the keeping a food journal and adhering to recommended goals of [address] calories and 110g+ protein and states she is following her eating plan approximately 80 % of the time.  She states she is exercising: None   Interim History:  She lives with her husband and two girls, age 23 and 74 The family has been very supportive during her health journey!  Reviewed Bioimpedance results with pt: Muscle Mass: +1 lb Adipose Mass: -5.6 lbs  Hunger/appetite-stable  Cravings- none  Exercise-None, will discuss after 5% weight loss achieved  Hydration-she is striving for at leas 100 oz water/day  Subjective:   1. Prediabetes - new onset Lab Results  Component Value Date   HGBA1C 5.9 (H) 05/11/2023     Latest Reference Range & Units 05/11/23 11:09  INSULIN 2.6 - 24.9 uIU/mL 7.1   Since eating on plan, she denies increased cravings She is able to avoid food temptations- even at family events.  2. B12 deficiency - new onset S  Latest Reference Range & Units 12/24/20 09:32 05/11/23 11:09  Vitamin B12 232 - 1,245 pg/mL 314 289  he recently started daily oral B12   3. Mood disorder (HCC) - emotional eating PCP manages daily Fluoxetine  20mg  Since eating on plan, she denies increased cravings She is able to avoid food temptations- even at family events.  Assessment/Plan:   1. Prediabetes - new onset Continue excellent protein intake  2. B12 deficiency - new onset (Primary) Continue daily oral B12 supplementation  3. Mood disorder (HCC) - emotional eating Remain active Continue SSRI per PCP  4. BMI 40.0-44.9, adult (HCC) - Current BMI 40.1  Holly Johnson is currently in the action stage of change. As such, her goal is to continue with weight loss efforts. She has agreed to keeping a food journal and adhering to recommended goals of 1450-1550 calories and 110g+ protein.   Exercise goals: No exercise has been prescribed at this time.  Behavioral modification strategies: increasing lean protein intake, decreasing simple carbohydrates, increasing vegetables, increasing water intake, decreasing eating out, no skipping meals, meal planning and cooking strategies, keeping healthy foods in the home, and planning for success.  [name] has agreed to follow-up with our clinic in 2 weeks. She was informed of the importance of frequent follow-up visits to maximize her success with intensive lifestyle modifications for her multiple health conditions.   Objective:   Blood pressure 136/82, pulse 65, temperature 98.3 F (36.8 C), height 5\' 7"  (1.702 m), weight 256 lb (116.1 kg), SpO2 96%. Body mass index is 40.1 kg/m.  General: Cooperative, alert, well developed, in no acute distress. HEENT: Conjunctivae and lids unremarkable. Cardiovascular: Regular rhythm.  Lungs: Normal  work of breathing. Neurologic: No focal deficits.   Lab Results  Component Value Date   CREATININE 0.85 05/11/2023   BUN 15 05/11/2023   NA 140 05/11/2023   K 4.4 05/11/2023   CL 104 05/11/2023   CO2 23 05/11/2023   Lab Results  Component Value Date   ALT 21 05/11/2023   AST 16 05/11/2023   ALKPHOS 58 05/11/2023   BILITOT 0.4 05/11/2023   Lab  Results  Component Value Date   HGBA1C 5.9 (H) 05/11/2023   Lab Results  Component Value Date   INSULIN 7.1 05/11/2023   Lab Results  Component Value Date   TSH 2.910 05/11/2023   Lab Results  Component Value Date   CHOL 193 05/11/2023   HDL 44 05/11/2023   LDLCALC 130 (H) 05/11/2023   TRIG 104 05/11/2023   CHOLHDL 4.4 06/24/2022   Lab Results  Component Value Date   VD25OH 67.7 05/11/2023   VD25OH 56.2 06/24/2022   VD25OH 26.9 (L) 12/24/2020   Lab Results  Component Value Date   WBC 6.6 05/11/2023   HGB 14.0 05/11/2023   HCT 42.3 05/11/2023   MCV 91 05/11/2023   PLT 249 05/11/2023   Lab Results  Component Value Date   IRON 123 12/24/2020   TIBC 326 12/24/2020   FERRITIN 138 12/24/2020   Attestation Statements:   Reviewed by clinician on day of visit: allergies, medications, problem list, medical history, surgical history, family history, social history, and previous encounter notes.  Time spent on visit including pre-visit chart review and post-visit care and charting was 27 minutes.   I have reviewed the above documentation for accuracy and completeness, and I agree with the above. -  Holly Johnson d. Holly Belzer, NP-C

## 2023-06-16 ENCOUNTER — Telehealth: Admitting: Physician Assistant

## 2023-06-16 DIAGNOSIS — R6889 Other general symptoms and signs: Secondary | ICD-10-CM

## 2023-06-16 MED ORDER — OSELTAMIVIR PHOSPHATE 75 MG PO CAPS: 75.0000 mg | ORAL_CAPSULE | Freq: Two times a day (BID) | ORAL | 0 refills | Status: AC

## 2023-06-16 NOTE — Progress Notes (Signed)
 I have spent 5 minutes in review of e-visit questionnaire, review and updating patient chart, medical decision making and response to patient.   Piedad Climes, PA-C

## 2023-06-16 NOTE — Progress Notes (Signed)

## 2023-06-29 ENCOUNTER — Other Ambulatory Visit: Payer: Self-pay | Admitting: Family Medicine

## 2023-06-29 ENCOUNTER — Ambulatory Visit (INDEPENDENT_AMBULATORY_CARE_PROVIDER_SITE_OTHER): Payer: BC Managed Care – PPO | Admitting: Adult Health

## 2023-06-29 ENCOUNTER — Encounter (INDEPENDENT_AMBULATORY_CARE_PROVIDER_SITE_OTHER): Payer: Self-pay | Admitting: Adult Health

## 2023-06-29 VITALS — BP 132/79 | HR 55 | Temp 98.1°F | Ht 67.0 in | Wt 252.0 lb

## 2023-06-29 DIAGNOSIS — E039 Hypothyroidism, unspecified: Secondary | ICD-10-CM

## 2023-06-29 DIAGNOSIS — Z6841 Body Mass Index (BMI) 40.0 and over, adult: Secondary | ICD-10-CM

## 2023-06-29 DIAGNOSIS — Z6839 Body mass index (BMI) 39.0-39.9, adult: Secondary | ICD-10-CM

## 2023-06-29 DIAGNOSIS — E538 Deficiency of other specified B group vitamins: Secondary | ICD-10-CM

## 2023-06-29 DIAGNOSIS — R7303 Prediabetes: Secondary | ICD-10-CM | POA: Diagnosis not present

## 2023-06-29 DIAGNOSIS — E669 Obesity, unspecified: Secondary | ICD-10-CM

## 2023-06-29 MED ORDER — LEVOTHYROXINE SODIUM 88 MCG PO TABS: 88.0000 ug | ORAL_TABLET | Freq: Every day | ORAL | 0 refills | Status: DC

## 2023-06-29 NOTE — Progress Notes (Signed)
 WEIGHT SUMMARY AND BIOMETRICS  Vitals Temp: 98.1 F (36.7 C) BP: 132/79 Pulse Rate: (!) 55 SpO2: 98 %   Anthropometric Measurements Height: 5\' 7"  (1.702 m) Weight: 252 lb (114.3 kg) BMI (Calculated): 39.46 Weight at Last Visit: 256 lb Weight Lost Since Last Visit: 4 lb Weight Gained Since Last Visit: 0 Starting Weight: 268 lb Peak Weight: 272 lb   Body Composition  Body Fat %: 47.1 % Fat Mass (lbs): 118.6 lbs Muscle Mass (lbs): 126.6 lbs Total Body Water (lbs): 94.4 lbs Visceral Fat Rating : 13   Other Clinical Data Fasting: No Labs: No Today's Visit #: 4 Starting Date: 05/11/23    Chief Complaint:   OBESITY Holly Johnson is here to discuss her progress with her obesity treatment plan.  She is on the the Category 2 Plan and states she is following her eating plan approximately 60 % of the time.  She states she is exercising: NEAT Activities   Interim History:  Holly Johnson feels that breakfast is easy to follow and will often "make adjustments on the fly" for lunch and dinner. Discussed calorie range and protein goal for lunch and dinner. Can add snack calories onto a meal if no snacks consumed.  Reviewed Bioimpedance Results with pt: Muscle Mass: -3.4 lbs Adipose Mass: -0.8 lbs  Visceral Adipose Rating currently 13  Subjective:   1. B12 deficiency - new onset She endorses stable energy levels. She has been consistently taking daily oral B12  2. Prediabetes - new onset 20 lb weight loss on Wegovy 2.4mg - last use Spring 2024 She has gained all lost weight back since stopping GLP-1 therapy  3. Acquired hypothyroidism  Latest Reference Range & Units 12/24/20 09:32 06/09/21 09:11 06/24/22 08:49 05/11/23 11:09  TSH 0.450 - 4.500 uIU/mL 2.770 3.420 2.690 2.910   She endorses stable energy levels. PCP manages morning Levothyroxine  Assessment/Plan:   1. B12 deficiency - new onset Continue daily oral B12  2. Prediabetes - new  onset Ensure to consume adeqiate protein at each meal. Check labs May 2025 If Prediabetes or Insulin Resistance persists- consider restarting Wegovy therapy  3. Acquired hypothyroidism Continue healthy eating  Continue morning Levothyroxine   4. BMI 40.0-44.9, adult (HCC) - Current BMI 39.5 (Primary)  Holly Johnson is currently in the action stage of change. As such, her goal is to continue with weight loss efforts. She has agreed to the Category 2 Plan.   Discussed calorie range and protein goal for lunch and dinner. Can add snack calories onto a meal if no snacks consumed. Eating Out Guide provided to patient  Exercise goals: No exercise has been prescribed at this time.  Behavioral modification strategies: increasing lean protein intake, decreasing simple carbohydrates, increasing vegetables, increasing water intake, no skipping meals, meal planning and cooking strategies, keeping healthy foods in the home, ways to avoid boredom eating, and planning for success.  Holly Johnson has agreed to follow-up with our clinic in 2 weeks. She was informed of the importance of frequent follow-up visits to maximize her success with intensive lifestyle modifications for her multiple health conditions.   Check Fasting Labs May 2025.  Objective:   Blood pressure 132/79, pulse (!) 55, temperature 98.1 F (36.7 C), height 5\' 7"  (1.702 m), weight 252 lb (114.3 kg), SpO2 98%. Body mass index is 39.47 kg/m.  General: Cooperative, alert, well developed, in no acute distress. HEENT: Conjunctivae and lids unremarkable. Cardiovascular: Regular rhythm.  Lungs: Normal work of breathing. Neurologic:  No focal deficits.   Lab Results  Component Value Date   CREATININE 0.85 05/11/2023   BUN 15 05/11/2023   NA 140 05/11/2023   K 4.4 05/11/2023   CL 104 05/11/2023   CO2 23 05/11/2023   Lab Results  Component Value Date   ALT 21 05/11/2023   AST 16 05/11/2023   ALKPHOS 58 05/11/2023   BILITOT 0.4  05/11/2023   Lab Results  Component Value Date   HGBA1C 5.9 (H) 05/11/2023   Lab Results  Component Value Date   INSULIN 7.1 05/11/2023   Lab Results  Component Value Date   TSH 2.910 05/11/2023   Lab Results  Component Value Date   CHOL 193 05/11/2023   HDL 44 05/11/2023   LDLCALC 130 (H) 05/11/2023   TRIG 104 05/11/2023   CHOLHDL 4.4 06/24/2022   Lab Results  Component Value Date   VD25OH 67.7 05/11/2023   VD25OH 56.2 06/24/2022   VD25OH 26.9 (L) 12/24/2020   Lab Results  Component Value Date   WBC 6.6 05/11/2023   HGB 14.0 05/11/2023   HCT 42.3 05/11/2023   MCV 91 05/11/2023   PLT 249 05/11/2023   Lab Results  Component Value Date   IRON 123 12/24/2020   TIBC 326 12/24/2020   FERRITIN 138 12/24/2020    Attestation Statements:   Reviewed by clinician on day of visit: allergies, medications, problem list, medical history, surgical history, family history, social history, and previous encounter notes.  Time spent on visit including pre-visit chart review and post-visit care and charting was 27 minutes.   I have reviewed the above documentation for accuracy and completeness, and I agree with the above. -  Starlee Corralejo d. Tyreanna Bisesi, NP-C

## 2023-07-01 NOTE — Telephone Encounter (Signed)
 Requested Prescriptions  Refused Prescriptions Disp Refills   SYNTHROID 88 MCG tablet [Pharmacy Med Name: SYNTHROID 88 MCG TAB] 90 tablet 0    Sig: TAKE 1 TABLET BY MOUTH ONCE DAILY ON AN EMPTY STOMACH. WAIT 30 MINUTES BEFORE TAKING OTHER MEDS.     Endocrinology:  Hypothyroid Agents Failed - 07/01/2023  9:53 AM      Failed - Valid encounter within last 12 months    Recent Outpatient Visits   None     Future Appointments             In 2 weeks Ashley Royalty, Ocie Bob, MD [facility]            Passed - TSH in normal range and within 360 days    TSH  Date Value Ref Range Status  05/11/2023 2.910 0.450 - 4.500 uIU/mL Final

## 2023-07-09 ENCOUNTER — Ambulatory Visit
Admission: RE | Admit: 2023-07-09 | Discharge: 2023-07-09 | Disposition: A | Discharge status: Home / Self Care | Source: Ambulatory Visit | Attending: Family Medicine | Admitting: Family Medicine

## 2023-07-09 ENCOUNTER — Encounter: Payer: BC Managed Care – PPO | Admitting: Family Medicine

## 2023-07-09 DIAGNOSIS — N63 Unspecified lump in unspecified breast: Secondary | ICD-10-CM | POA: Insufficient documentation

## 2023-07-09 DIAGNOSIS — N6001 Solitary cyst of right breast: Secondary | ICD-10-CM | POA: Diagnosis not present

## 2023-07-09 DIAGNOSIS — R92323 Mammographic fibroglandular density, bilateral breasts: Secondary | ICD-10-CM | POA: Diagnosis not present

## 2023-07-09 DIAGNOSIS — N6311 Unspecified lump in the right breast, upper outer quadrant: Secondary | ICD-10-CM | POA: Diagnosis not present

## 2023-07-09 DIAGNOSIS — D241 Benign neoplasm of right breast: Secondary | ICD-10-CM | POA: Diagnosis not present

## 2023-07-10 DIAGNOSIS — G4733 Obstructive sleep apnea (adult) (pediatric): Secondary | ICD-10-CM | POA: Diagnosis not present

## 2023-07-16 ENCOUNTER — Ambulatory Visit (INDEPENDENT_AMBULATORY_CARE_PROVIDER_SITE_OTHER): Payer: Self-pay | Admitting: Family Medicine

## 2023-07-16 ENCOUNTER — Encounter: Payer: Self-pay | Admitting: Family Medicine

## 2023-07-16 VITALS — BP 124/72 | HR 85 | Ht 67.0 in | Wt 259.0 lb

## 2023-07-16 DIAGNOSIS — E039 Hypothyroidism, unspecified: Secondary | ICD-10-CM

## 2023-07-16 DIAGNOSIS — Z0000 Encounter for general adult medical examination without abnormal findings: Secondary | ICD-10-CM | POA: Diagnosis not present

## 2023-07-16 DIAGNOSIS — G4733 Obstructive sleep apnea (adult) (pediatric): Secondary | ICD-10-CM

## 2023-07-16 DIAGNOSIS — F419 Anxiety disorder, unspecified: Secondary | ICD-10-CM | POA: Diagnosis not present

## 2023-07-16 MED ORDER — SYNTHROID 88 MCG PO TABS: 88.0000 ug | ORAL_TABLET | Freq: Every day | ORAL | 0 refills | Status: DC

## 2023-07-16 MED ORDER — FLUOXETINE HCL 10 MG PO CAPS: 10.0000 mg | ORAL_CAPSULE | Freq: Every day | ORAL | 3 refills | Status: DC

## 2023-07-16 MED ORDER — TRAZODONE HCL 50 MG PO TABS
25.0000 mg | ORAL_TABLET | Freq: Every evening | ORAL | 1 refills | Status: AC | PRN
Start: 2023-07-16 — End: ?

## 2023-07-16 NOTE — Progress Notes (Signed)
 Annual Physical Exam Visit  Patient Information:  Patient ID: Holly Johnson, female DOB: Apr 10, 1975 Age: 48 y.o. MRN: [id number]   Subjective:   CC: Annual Physical Exam  HPI:  Holly Johnson is here for their annual physical.  I reviewed the past medical history, family history, social history, surgical history, and allergies today and changes were made as necessary.  Please see the problem list section below for additional details.  Past Medical History: Past Medical History:  Diagnosis Date   Allergy    Anemia    Anxiety    Back pain    Basal cell carcinoma 2020   basal cell carcinoma   Biliary dyskinesia 07/25/2015   Cataracts, bilateral    Chest pain    a. 02/2021 Cor CTA: Cor Ca2+ = 0. Nl cors. No significant noncardiac findings.   Gallbladder problem    Hypothyroid    Joint pain    Morbid obesity (HCC)    Obesity    Palpitations    PSVT (paroxysmal supraventricular tachycardia) (HCC)    a. 12/2020 Zio: Occas PSVT - fastest 162 x 7 beats, longest 9 beats @ 111.   Seasonal allergies    Sleep apnea    Symptomatic PVC's (premature ventricular contractions)    a. 12/2020 Zio: 5.3% PVC burden. Triggered events assoc w/ PVCs.   Vitamin D  deficiency    Past Surgical History: Past Surgical History:  Procedure Laterality Date   BREAST BIOPSY Right 2017   fibroadenoma   CATARACT EXTRACTION Bilateral 12/2021   CHOLECYSTECTOMY  2018   MOHS SURGERY  2020   x 3   SPINE SURGERY     Correct Spina [phone]   Family History: Family History  Problem Relation Age of Onset   Thyroid  disease Mother    Hypertension Mother    Diabetes Mother    Osteoarthritis Mother    Hyperlipidemia Mother    Skin cancer Mother    Obesity Mother    Depression Mother    Sleep apnea Mother    Sleep apnea Father    Hypertension Father    Hyperlipidemia Father    Skin cancer Father    Gallbladder disease Father    Cancer Father        gallbladder cancer   Liver  disease Father    ADD / ADHD Sister    Anxiety disorder Sister    Hypertension Sister    Sleep apnea Sister    Heart disease Maternal Grandmother    Hypertension Maternal Grandmother    Osteoarthritis Maternal Grandmother    Thyroid  disease Maternal Grandmother    Lung cancer Maternal Grandfather    Ovarian cancer Paternal Grandmother    Dementia Paternal Grandfather    ADD / ADHD Daughter    Breast cancer Neg Hx    Allergies: No Known Allergies Health Maintenance: Health Maintenance  Topic Date Due   INFLUENZA VACCINE  10/29/2023   Fecal DNA (Cologuard)  07/27/2024   Cervical Cancer Screening (HPV/Pap Cotest)  05/13/2025   DTaP/Tdap/Td (2 - Td or Tdap) 12/25/2030   Hepatitis C Screening  Completed   HIV Screening  Completed   HPV VACCINES  Aged Out   Meningococcal B Vaccine  Aged Out   COVID-19 Vaccine  Discontinued    HM Colonoscopy   This patient has no relevant Health Maintenance data.    Medications: Current Outpatient Medications on File Prior to Visit  Medication Sig Dispense Refill   cetirizine (ZYRTEC) 10 MG  tablet Take 10 mg by mouth daily.     cyanocobalamin  (VITAMIN B12) 500 MCG tablet Take 1 tablet (500 mcg total) by mouth daily.     FLUoxetine  (PROZAC ) 20 MG capsule TAKE 1 CAPSULE BY MOUTH ONCE DAILY 90 capsule 0   levonorgestrel  (MIRENA ) 20 MCG/24HR IUD 1 each by Intrauterine route once.     VITAMIN D  PO Take 5,000 Units by mouth daily.     No current facility-administered medications on file prior to visit.    Objective:   Vitals:   07/16/23 0801  BP: 124/72  Pulse: 85  SpO2: 96%   Vitals:   07/16/23 0801  Weight: 259 lb (117.5 kg)  Height: 5' 7 (1.702 m)   Body mass index is 40.57 kg/m.  General: Well Developed, well nourished, and in no acute distress.  Neuro: Alert and oriented x3, extra-ocular muscles intact, sensation grossly intact. Cranial nerves II through XII are grossly intact, motor, sensory, and coordinative functions are  intact. HEENT: Normocephalic, atraumatic, neck supple, no masses, no lymphadenopathy, thyroid  nonenlarged. Oropharynx, nasopharynx, external ear canals are unremarkable. Skin: Warm and dry, no rashes noted.  Cardiac: Regular rate and rhythm, no murmurs rubs or gallops. No peripheral edema. Pulses symmetric. Respiratory: Clear to auscultation bilaterally. Speaking in full sentences.  Abdominal: Soft, nontender, nondistended, positive bowel sounds, no masses, no organomegaly. Musculoskeletal: Stable, and with full range of motion.   Impression and Recommendations:   The patient was counselled, risk factors were discussed, and anticipatory guidance given.  Problem List Items Addressed This Visit     Acquired hypothyroidism   She has been taking a generic version of Synthroid  for the past 30 days after running out of her usual medication. She feels the generic version has not been effective, leading to worsening symptoms, and is considering switching back to the name brand Synthroid .  Hypothyroidism Suboptimal symptom control with generic levothyroxine . Prefers Synthroid  for better control. Mail order pharmacy may offer cost-effective solution. Synthroid  Direct. - Investigate mail order options for Synthroid . - If not feasible, send prescription for 90-day supply of Synthroid  to local pharmacy. - TSH to assess thyroid  function through her Healthy weight and wellness group.      Relevant Medications   SYNTHROID  88 MCG tablet   Anxiety   She is experiencing high levels of stress and anxiety, describing it as 'through the roof.' Her GAD score is 17, indicating significant anxiety. She takes 20 mg of fluoxetine  daily but has not seen her therapist in a month due to personal circumstances, including funerals and sickness. She attributes some of her anxiety to situational factors, such as her husband's recent hospitalization and her daughter's new milestones, including driving and having a  boyfriend.  She is noticing adjunct sleep disturbances.  Generalized Anxiety Disorder (GAD) GAD exacerbated by life stressors. Anxiety with GAD-7 score of 17. Persistent symptoms on fluoxetine  20 mg. Discussed options and have agreed upon titration of fluoxetine . - Increase fluoxetine  to 30 mg, titrate up to 60 mg as needed at week+ intervals. - Provide 10 mg fluoxetine  tablets for flexible dosing. - Encourage regular therapy sessions. - Utilize trazodone  for sleep disturbances at a dose of 25-100 mg nightly on an as-needed basis. - Incorporate sleep hygiene methods as well.      Relevant Medications   FLUoxetine  (PROZAC ) 10 MG capsule   traZODone  (DESYREL ) 50 MG tablet   Healthcare maintenance - Primary   Annual examination completed, risk stratification labs ordered, anticipatory guidance provided.  We will  follow labs once resulted.      Relevant Orders   CBC   Comprehensive metabolic panel with GFR   OSA on CPAP   Sleep Disturbance Sleep disturbance secondary to anxiety and stress. Difficulty initiating sleep and waking groggy despite CPAP use. Trazodone  considered for sleep aid. - Prescribe trazodone  25-100 mg PRN for sleep, adjust dose based on effectiveness and side effects. - Continue nightly CPAP use.        Orders & Medications Medications:  Meds ordered this encounter  Medications   FLUoxetine  (PROZAC ) 10 MG capsule    Sig: Take 1 capsule (10 mg total) by mouth daily. Titrate weekly, do not exceed 60 mg    Dispense:  90 capsule    Refill:  3   traZODone  (DESYREL ) 50 MG tablet    Sig: Take 0.5-2 tablets (25-100 mg total) by mouth at bedtime as needed.    Dispense:  60 tablet    Refill:  1   SYNTHROID  88 MCG tablet    Sig: Take 1 tablet (88 mcg total) by mouth daily before breakfast. TAKE 1 TABLET BY MOUTH ONCE DAILY ON AN EMPTY STOMACH. WAIT 30 MINUTES BEFORE TAKING OTHER MEDS.    Dispense:  90 tablet    Refill:  0   Orders Placed This Encounter  Procedures    CBC   Comprehensive metabolic panel with GFR     Return in about 2 months (around 09/15/2023) for Video Visit f/u medication changes.    Selinda JINNY Ku, MD, [facility]   Primary Care Sports Medicine Primary Care and Sports Medicine at [facility]

## 2023-07-16 NOTE — Patient Instructions (Addendum)
-   Obtain fasting labs with orders provided (can have water or black coffee but otherwise no food or drink x 8 hours before labs) - Review information provided - Attend eye doctor annually, dentist every 6 months, work towards or maintain 30 minutes of mo

## 2023-07-16 NOTE — Assessment & Plan Note (Signed)
 Annual examination completed, risk stratification labs ordered, anticipatory guidance provided.  We will follow labs once resulted.

## 2023-07-16 NOTE — Assessment & Plan Note (Signed)
 Sleep Disturbance Sleep disturbance secondary to anxiety and stress. Difficulty initiating sleep and waking groggy despite CPAP use. Trazodone  considered for sleep aid. - Prescribe trazodone  25-100 mg PRN for sleep, adjust dose based on effectiveness and side effects. - Continue nightly CPAP use.

## 2023-07-16 NOTE — Assessment & Plan Note (Signed)
 She is experiencing high levels of stress and anxiety, describing it as 'through the roof.' Her GAD score is 17, indicating significant anxiety. She takes 20 mg of fluoxetine  daily but has not seen her therapist in a month due to personal circumstances, including funerals and sickness. She attributes some of her anxiety to situational factors, such as her husband's recent hospitalization and her daughter's new milestones, including driving and having a boyfriend.  She is noticing adjunct sleep disturbances.  Generalized Anxiety Disorder (GAD) GAD exacerbated by life stressors. Anxiety with GAD-7 score of 17. Persistent symptoms on fluoxetine  20 mg. Discussed options and have agreed upon titration of fluoxetine . - Increase fluoxetine  to 30 mg, titrate up to 60 mg as needed at week+ intervals. - Provide 10 mg fluoxetine  tablets for flexible dosing. - Encourage regular therapy sessions. - Utilize trazodone  for sleep disturbances at a dose of 25-100 mg nightly on an as-needed basis. - Incorporate sleep hygiene methods as well.

## 2023-07-16 NOTE — Assessment & Plan Note (Signed)
 She has been taking a generic version of Synthroid  for the past 30 days after running out of her usual medication. She feels the generic version has not been effective, leading to worsening symptoms, and is considering switching back to the name brand Synthroid .   Hypothyroidism Suboptimal symptom control with generic levothyroxine . Prefers Synthroid  for better control. Mail order pharmacy may offer cost-effective solution. Synthroid  Direct. - Investigate mail order options for Synthroid . - If not feasible, send prescription for 90-day supply of Synthroid  to local pharmacy. - TSH to assess thyroid  function through her Healthy weight and wellness group.

## 2023-07-28 ENCOUNTER — Encounter (INDEPENDENT_AMBULATORY_CARE_PROVIDER_SITE_OTHER): Payer: Self-pay | Admitting: Adult Health

## 2023-07-28 ENCOUNTER — Ambulatory Visit (INDEPENDENT_AMBULATORY_CARE_PROVIDER_SITE_OTHER): Admitting: Adult Health

## 2023-07-28 VITALS — BP 127/77 | HR 65 | Temp 98.3°F | Ht 67.0 in | Wt 253.0 lb

## 2023-07-28 DIAGNOSIS — E7849 Other hyperlipidemia: Secondary | ICD-10-CM

## 2023-07-28 DIAGNOSIS — Z85828 Personal history of other malignant neoplasm of skin: Secondary | ICD-10-CM | POA: Diagnosis not present

## 2023-07-28 DIAGNOSIS — E559 Vitamin D deficiency, unspecified: Secondary | ICD-10-CM | POA: Diagnosis not present

## 2023-07-28 DIAGNOSIS — R7303 Prediabetes: Secondary | ICD-10-CM | POA: Diagnosis not present

## 2023-07-28 DIAGNOSIS — Z6841 Body Mass Index (BMI) 40.0 and over, adult: Secondary | ICD-10-CM

## 2023-07-28 DIAGNOSIS — E039 Hypothyroidism, unspecified: Secondary | ICD-10-CM | POA: Diagnosis not present

## 2023-07-28 DIAGNOSIS — E669 Obesity, unspecified: Secondary | ICD-10-CM

## 2023-07-28 DIAGNOSIS — D485 Neoplasm of uncertain behavior of skin: Secondary | ICD-10-CM | POA: Diagnosis not present

## 2023-07-28 DIAGNOSIS — D225 Melanocytic nevi of trunk: Secondary | ICD-10-CM | POA: Diagnosis not present

## 2023-07-28 DIAGNOSIS — L578 Other skin changes due to chronic exposure to nonionizing radiation: Secondary | ICD-10-CM | POA: Diagnosis not present

## 2023-07-28 DIAGNOSIS — Z86018 Personal history of other benign neoplasm: Secondary | ICD-10-CM | POA: Diagnosis not present

## 2023-07-28 DIAGNOSIS — Z6839 Body mass index (BMI) 39.0-39.9, adult: Secondary | ICD-10-CM

## 2023-07-28 DIAGNOSIS — E538 Deficiency of other specified B group vitamins: Secondary | ICD-10-CM

## 2023-07-28 DIAGNOSIS — F411 Generalized anxiety disorder: Secondary | ICD-10-CM

## 2023-07-28 DIAGNOSIS — Z872 Personal history of diseases of the skin and subcutaneous tissue: Secondary | ICD-10-CM | POA: Diagnosis not present

## 2023-07-28 NOTE — Progress Notes (Signed)
 WEIGHT SUMMARY AND BIOMETRICS  Vitals Temp: 98.3 F (36.8 C) BP: 127/77 Pulse Rate: 65 SpO2: 97 %   Anthropometric Measurements Height: 5\' 7"  (1.702 m) Weight: 253 lb (114.8 kg) BMI (Calculated): 39.62 Weight at Last Visit: 252 lb Weight Lost Since Last Visit: 0 Weight Gained Since Last Visit: 1 lb Starting Weight: 268 lb Total Weight Loss (lbs): 7 lb (3.175 kg) Peak Weight: 272 lb   Body Composition  Body Fat %: 47.7 % Fat Mass (lbs): 120.8 lbs Muscle Mass (lbs): 126 lbs Total Body Water (lbs): 97.4 lbs Visceral Fat Rating : 14   Other Clinical Data RMR: 2002 Fasting: no Labs: no Today's Visit #: 5 Starting Date: 05/11/23    Chief Complaint:   OBESITY Holly Johnson is here to discuss her progress with her obesity treatment plan.  She is on the the Category 2 Plan and states she is following her eating plan approximately 25 % of the time.  She states she is exercising: None  Interim History:  Since last OV at [facility]- Her husband, Audrene Blessing, age 47- hospitalized for COPD exacerbation. 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  Subjective:   1. Other hyperlipidemia Lipid Panel     Component Value Date/Time   CHOL 193 05/11/2023 1109   TRIG 104 05/11/2023 1109   HDL 44 05/11/2023 1109   CHOLHDL 4.4 06/24/2022 0849   LDLCALC 130 (H) 05/11/2023 1109   LABVLDL 19 05/11/2023 1109    The 10-year ASCVD risk score (Arnett DK, et al., 2019) is: 1.3%   She denies CP with exertion. Not on statin therapy  2. Prediabetes - new onset    Latest Reference Range & Units 05/11/23 11:09  Glucose 70 - 99 mg/dL 88  Hemoglobin Z6X 4.8 - 5.6 % 5.9 (H)  Est. average glucose Bld gHb Est-mCnc mg/dL 096  INSULIN  2.6 - 24.9 uIU/mL 7.1  (H): Data is abnormally high  20 lb weight loss  on Wegovy  2.4mg - last use Spring 2024 She has gained all lost weight back since stopping GLP-1 therapy She denies family hx of MENS 2 or MTC She denies personal hx of pancreatitis Birth Control- Mirena   3. Acquired hypothyroidism  Latest Reference Range & Units 12/24/20 09:32 06/09/21 09:11 06/24/22 08:49 05/11/23 11:09  TSH 0.450 - 4.500 uIU/mL 2.770 3.420 2.690 2.910   She endorses stable energy levels. PCP manages morning Levothyroxine   4. Vitamin D  deficiency  Latest Reference Range & Units 06/24/22 08:49 05/11/23 11:09  Vitamin D , 25-Hydroxy 30.0 - 100.0 ng/mL 56.2 67.7   She is on OTC Vit D 3 5,000 international units  daily  5. B12 deficiency - new onset  Latest Reference Range & Units 12/24/20 09:32 05/11/23 11:09  Vitamin B12 232 - 1,245 pg/mL 314 289    6. GAD (generalized anxiety disorder) PCP recently increased her SSRI He provided a detailed titrated schedule She is currently on Prozac  30mg  She denies SI/HI She endorses reduced levels of anxiety  Assessment/Plan:   1. Other hyperlipidemia Continue healthy eating and increase daily activity  2. Prediabetes - new onset (Primary) Continue healthy eating and increase daily activity  3. Acquired hypothyroidism Monitor Labs and continue morning Levothyroxine   4. Vitamin D  deficiency Continue OTC Supplement  5. B12 deficiency - new onset Increase B12 rich  foods  6. GAD (generalized anxiety disorder) Continue Prozac  titration per PCP Continue healthy eating and increase daily activity  7. BMI 40.0-44.9, adult (HCC) - Current BMI 39.7  Kanylah is currently in the action stage of change. As such, her goal is to continue with weight loss efforts. She has agreed to the Category 2 Plan.   Exercise goals: All adults should avoid inactivity. Some physical activity is better than none, and adults who participate in any amount of physical activity gain some health benefits. Adults should also include  muscle-strengthening activities that involve all major muscle groups on 2 or more days a week.  Behavioral modification strategies: increasing lean protein intake, decreasing simple carbohydrates, increasing vegetables, increasing water intake, no skipping meals, meal planning and cooking strategies, keeping healthy foods in the home, ways to avoid boredom eating, and planning for success.  Javier has agreed to follow-up with our clinic in 4 weeks. She was informed of the importance of frequent follow-up visits to maximize her success with intensive lifestyle modifications for her multiple health conditions.  Objective:   Blood pressure 127/77, pulse 65, temperature 98.3 F (36.8 C), height 5\' 7"  (1.702 m), weight 253 lb (114.8 kg), SpO2 97%. Body mass index is 39.63 kg/m.  General: Cooperative, alert, well developed, in no acute distress. HEENT: Conjunctivae and lids unremarkable. Cardiovascular: Regular rhythm.  Lungs: Normal work of breathing. Neurologic: No focal deficits.   Lab Results  Component Value Date   CREATININE 0.85 05/11/2023   BUN 15 05/11/2023   NA 140 05/11/2023   K 4.4 05/11/2023   CL 104 05/11/2023   CO2 23 05/11/2023   Lab Results  Component Value Date   ALT 21 05/11/2023   AST 16 05/11/2023   ALKPHOS 58 05/11/2023   BILITOT 0.4 05/11/2023   Lab Results  Component Value Date   HGBA1C 5.9 (H) 05/11/2023   Lab Results  Component Value Date   INSULIN  7.1 05/11/2023   Lab Results  Component Value Date   TSH 2.910 05/11/2023   Lab Results  Component Value Date   CHOL 193 05/11/2023   HDL 44 05/11/2023   LDLCALC 130 (H) 05/11/2023   TRIG 104 05/11/2023   CHOLHDL 4.4 06/24/2022   Lab Results  Component Value Date   VD25OH 67.7 05/11/2023   VD25OH 56.2 06/24/2022   VD25OH 26.9 (L) 12/24/2020   Lab Results  Component Value Date   WBC 6.6 05/11/2023   HGB 14.0 05/11/2023   HCT 42.3 05/11/2023   MCV 91 05/11/2023   PLT 249 05/11/2023    Lab Results  Component Value Date   IRON 123 12/24/2020   TIBC 326 12/24/2020   FERRITIN 138 12/24/2020    Attestation Statements:   Reviewed by clinician on day of visit: allergies, medications, problem list, medical history, surgical history, family history, social history, and previous encounter notes.  Time spent on visit including pre-visit chart review and post-visit care and charting was 28 minutes.  Nutrition, hydration, and activity strategies. Weight loss medications discussed.  I have reviewed the above documentation for accuracy and completeness, and I agree with the above. -  Ryn Peine d. Amaar Oshita, NP-C

## 2023-08-11 DIAGNOSIS — F411 Generalized anxiety disorder: Secondary | ICD-10-CM | POA: Diagnosis not present

## 2023-08-16 ENCOUNTER — Ambulatory Visit (INDEPENDENT_AMBULATORY_CARE_PROVIDER_SITE_OTHER): Admitting: Adult Health

## 2023-08-16 ENCOUNTER — Encounter (INDEPENDENT_AMBULATORY_CARE_PROVIDER_SITE_OTHER): Payer: Self-pay | Admitting: Adult Health

## 2023-08-16 VITALS — BP 134/92 | HR 60 | Temp 98.1°F | Ht 67.0 in | Wt 247.0 lb

## 2023-08-16 DIAGNOSIS — E669 Obesity, unspecified: Secondary | ICD-10-CM

## 2023-08-16 DIAGNOSIS — E538 Deficiency of other specified B group vitamins: Secondary | ICD-10-CM | POA: Diagnosis not present

## 2023-08-16 DIAGNOSIS — R7303 Prediabetes: Secondary | ICD-10-CM | POA: Diagnosis not present

## 2023-08-16 DIAGNOSIS — R5383 Other fatigue: Secondary | ICD-10-CM

## 2023-08-16 DIAGNOSIS — E7849 Other hyperlipidemia: Secondary | ICD-10-CM | POA: Diagnosis not present

## 2023-08-16 DIAGNOSIS — Z6838 Body mass index (BMI) 38.0-38.9, adult: Secondary | ICD-10-CM

## 2023-08-16 DIAGNOSIS — F411 Generalized anxiety disorder: Secondary | ICD-10-CM

## 2023-08-16 DIAGNOSIS — E559 Vitamin D deficiency, unspecified: Secondary | ICD-10-CM

## 2023-08-16 DIAGNOSIS — Z6841 Body Mass Index (BMI) 40.0 and over, adult: Secondary | ICD-10-CM

## 2023-08-16 NOTE — Progress Notes (Signed)
 WEIGHT SUMMARY AND BIOMETRICS  Vitals Temp: 98.1 F (36.7 C) BP: (!) 134/92 Pulse Rate: 60 SpO2: 96 %   Anthropometric Measurements Height: 5\' 7"  (1.702 m) Weight: 247 lb (112 kg) BMI (Calculated): 38.68 Weight at Last Visit: 253 lb Weight Lost Since Last Visit: 6 lb Weight Gained Since Last Visit: 0 Starting Weight: 268 lb Total Weight Loss (lbs): 13 lb (5.897 kg) Peak Weight: 272 lb   Body Composition  Body Fat %: 45.3 % Fat Mass (lbs): 112 lbs Muscle Mass (lbs): 128.6 lbs Total Body Water (lbs): 89.8 lbs Visceral Fat Rating : 13   Other Clinical Data Fasting: Yes Labs: Yes Today's Visit #: 6 Starting Date: 05/11/23    Chief Complaint:   OBESITY Holly Johnson is here to discuss her progress with her obesity treatment plan.  She is on the the Category 2 Plan and states she is following her eating plan approximately 40 % of the time.  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  Subjective:   1. Other fatigue She endorses increased energy and recently improved sleep (low dose PRN Trazodone )  2. Other hyperlipidemia Lipid Panel     Component Value Date/Time   CHOL 193 05/11/2023 1109   TRIG 104 05/11/2023 1109   HDL 44 05/11/2023 1109   CHOLHDL 4.4 06/24/2022 0849   LDLCALC 130 (H) 05/11/2023 1109   LABVLDL 19 05/11/2023 1109   The 10-year ASCVD risk score (Arnett DK, et al., 2019) is: 1.5%   Values used to calculate the score:     Age: 48 years     Sex: Female     Is Non-Hispanic African American: No     Diabetic: No     Tobacco smoker: No     Systolic Blood Pressure: 134 mmHg     Is BP treated: No     HDL Cholesterol: 44 mg/dL     Total Cholesterol: 193 mg/dL   Both of her parents have HLD on statin therapy Holly Johnson  denies tobacco/vape use  3. Prediabetes - new onset  Latest Reference Range & Units 05/11/23 11:09  Glucose 70 - 99 mg/dL 88  Hemoglobin K7Q 4.8 - 5.6 % 5.9 (H)  Est. average glucose Bld gHb Est-mCnc mg/dL 259  INSULIN  2.6 - 24.9 uIU/mL 7.1  (H): Data is abnormally high  She denies breakthrough polyphagia She continues to try and increase plan water intake throughout the day.  4. B12 deficiency - new onset She recently started on daily oral B12 500mcg She endorse recent increased energy levels  Latest Reference Range & Units 05/11/23 11:09      Vitamin B12 232 - 1,245 pg/mL 289   6. Vitamin D  deficiency She endorse recent increased energy levels  Latest Reference Range & Units 05/11/23 11:09  Vitamin D , 25-Hydroxy 30.0 - 100.0 ng/mL 67.7       5. GAD (generalized anxiety disorder) She has titrated up to 40mg  Prozac  daily She feels that her anxiety levels have decreased and denies SI/HI  Assessment/Plan:   1. Other fatigue Check Labs -Vit D -B12   2. Other hyperlipidemia (Primary) Check Labs - Comprehensive metabolic panel with GFR - Lipid panel  3. Prediabetes - new onset Check Labs - Hemoglobin A1c - Insulin , random  4. B12 deficiency - new onset Check Labs - Vitamin B12  6. Vitamin D  deficiency Check Labs - VITAMIN D  25 Hydroxy (Vit-D Deficiency, Fractures)  5. GAD (generalized anxiety disorder) Increase daily activity Continue SSRI per PCP  7. BMI 40.0-44.9, adult (HCC) - Current BMI 38.7  Holly Johnson is currently in the action stage of change. As such, her goal is to continue with weight loss efforts. She has agreed to the Category 2 Plan.   Exercise goals: Assignment: Think about what physical activities are enjoyable and sustainable  Behavioral modification strategies: increasing lean protein intake, decreasing simple carbohydrates, increasing vegetables, increasing water intake, no skipping meals, meal planning and cooking strategies, keeping  healthy foods in the home, ways to avoid boredom eating, travel eating strategies, and planning for success.  Holly Johnson has agreed to follow-up with our clinic in 4 weeks. She was informed of the importance of frequent follow-up visits to maximize her success with intensive lifestyle modifications for her multiple health conditions.   Holly Johnson was informed we would discuss her lab results at her next visit unless there is a critical issue that needs to be addressed sooner. Holly Johnson agreed to keep her next visit at the agreed upon time to discuss these results.  Objective:   Blood pressure (!) 134/92, pulse 60, temperature 98.1 F (36.7 C), height 5\' 7"  (1.702 m), weight 247 lb (112 kg), SpO2 96%. Body mass index is 38.69 kg/m.  General: Cooperative, alert, well developed, in no acute distress. HEENT: Conjunctivae and lids unremarkable. Cardiovascular: Regular rhythm.  Lungs: Normal work of breathing. Neurologic: No focal deficits.   Lab Results  Component Value Date   CREATININE 0.85 05/11/2023   BUN 15 05/11/2023   NA 140 05/11/2023   K 4.4 05/11/2023   CL 104 05/11/2023   CO2 23 05/11/2023   Lab Results  Component Value Date   ALT 21 05/11/2023   AST 16 05/11/2023   ALKPHOS 58 05/11/2023   BILITOT 0.4 05/11/2023   Lab Results  Component Value Date   HGBA1C 5.9 (H) 05/11/2023   Lab Results  Component Value Date   INSULIN  7.1 05/11/2023   Lab Results  Component Value Date   TSH 2.910 05/11/2023   Lab Results  Component Value Date   CHOL 193 05/11/2023   HDL 44 05/11/2023   LDLCALC 130 (H) 05/11/2023   TRIG 104 05/11/2023   CHOLHDL 4.4 06/24/2022   Lab Results  Component Value Date   VD25OH 67.7 05/11/2023   VD25OH 56.2 06/24/2022   VD25OH 26.9 (L) 12/24/2020   Lab Results  Component Value Date   WBC 6.6 05/11/2023   HGB 14.0 05/11/2023   HCT 42.3 05/11/2023   MCV 91 05/11/2023   PLT 249 05/11/2023   Lab Results  Component Value Date   IRON 123  12/24/2020   TIBC 326 12/24/2020   FERRITIN 138 12/24/2020   Attestation Statements:   Reviewed by clinician on day of visit: allergies, medications, problem list, medical history, surgical history, family history, social history, and previous encounter notes.  I have reviewed the above documentation for accuracy and completeness, and I agree with the above. -  Dontrell Stuck d. Hermela Hardt, NP-C

## 2023-08-17 LAB — COMPREHENSIVE METABOLIC PANEL WITH GFR
ALT: 11 IU/L (ref 0–32)
AST: 12 IU/L (ref 0–40)
Albumin: 4.6 g/dL (ref 3.9–4.9)
Alkaline Phosphatase: 62 IU/L (ref 44–121)
BUN/Creatinine Ratio: 16 (ref 9–23)
BUN: 15 mg/dL (ref 6–24)
Bilirubin Total: 0.5 mg/dL (ref 0.0–1.2)
CO2: 22 mmol/L (ref 20–29)
Calcium: 9.2 mg/dL (ref 8.7–10.2)
Chloride: 104 mmol/L (ref 96–106)
Creatinine, Ser: 0.93 mg/dL (ref 0.57–1.00)
Globulin, Total: 2.3 g/dL (ref 1.5–4.5)
Glucose: 89 mg/dL (ref 70–99)
Potassium: 4.1 mmol/L (ref 3.5–5.2)
Sodium: 141 mmol/L (ref 134–144)
Total Protein: 6.9 g/dL (ref 6.0–8.5)
eGFR: 76 mL/min/{1.73_m2} (ref >59)

## 2023-08-17 LAB — LIPID PANEL
Chol/HDL Ratio: 4.7 ratio — ABNORMAL HIGH (ref 0.0–4.4)
Cholesterol, Total: 194 mg/dL (ref 100–199)
HDL: 41 mg/dL (ref >39)
LDL Chol Calc (NIH): 133 mg/dL — ABNORMAL HIGH (ref 0–99)
Triglycerides: 111 mg/dL (ref 0–149)
VLDL Cholesterol Cal: 20 mg/dL (ref 5–40)

## 2023-08-17 LAB — INSULIN, RANDOM: INSULIN: 16.6 u[IU]/mL (ref 2.6–24.9)

## 2023-08-17 LAB — VITAMIN D 25 HYDROXY (VIT D DEFICIENCY, FRACTURES): Vit D, 25-Hydroxy: 70.5 ng/mL (ref 30.0–100.0)

## 2023-08-17 LAB — HEMOGLOBIN A1C
Est. average glucose Bld gHb Est-mCnc: 114 mg/dL
Hgb A1c MFr Bld: 5.6 % (ref 4.8–5.6)

## 2023-08-17 LAB — VITAMIN B12: Vitamin B-12: 643 pg/mL (ref 232–1245)

## 2023-08-30 DIAGNOSIS — F411 Generalized anxiety disorder: Secondary | ICD-10-CM | POA: Diagnosis not present

## 2023-09-01 DIAGNOSIS — R2689 Other abnormalities of gait and mobility: Secondary | ICD-10-CM | POA: Diagnosis not present

## 2023-09-01 DIAGNOSIS — M25552 Pain in left hip: Secondary | ICD-10-CM | POA: Diagnosis not present

## 2023-09-01 DIAGNOSIS — M25652 Stiffness of left hip, not elsewhere classified: Secondary | ICD-10-CM | POA: Diagnosis not present

## 2023-09-06 ENCOUNTER — Encounter: Payer: Self-pay | Admitting: Family Medicine

## 2023-09-07 ENCOUNTER — Telehealth: Payer: Self-pay | Admitting: *Deleted

## 2023-09-07 ENCOUNTER — Ambulatory Visit (INDEPENDENT_AMBULATORY_CARE_PROVIDER_SITE_OTHER): Admitting: Adult Health

## 2023-09-07 ENCOUNTER — Other Ambulatory Visit: Payer: Self-pay

## 2023-09-07 VITALS — BP 119/82 | HR 66 | Temp 98.9°F | Ht 67.0 in | Wt 249.0 lb

## 2023-09-07 DIAGNOSIS — R7303 Prediabetes: Secondary | ICD-10-CM

## 2023-09-07 DIAGNOSIS — E538 Deficiency of other specified B group vitamins: Secondary | ICD-10-CM

## 2023-09-07 DIAGNOSIS — Z6839 Body mass index (BMI) 39.0-39.9, adult: Secondary | ICD-10-CM

## 2023-09-07 DIAGNOSIS — R5383 Other fatigue: Secondary | ICD-10-CM

## 2023-09-07 DIAGNOSIS — F411 Generalized anxiety disorder: Secondary | ICD-10-CM

## 2023-09-07 DIAGNOSIS — E781 Pure hyperglyceridemia: Secondary | ICD-10-CM | POA: Diagnosis not present

## 2023-09-07 DIAGNOSIS — E559 Vitamin D deficiency, unspecified: Secondary | ICD-10-CM | POA: Diagnosis not present

## 2023-09-07 DIAGNOSIS — Z6841 Body Mass Index (BMI) 40.0 and over, adult: Secondary | ICD-10-CM

## 2023-09-07 MED ORDER — ZEPBOUND 2.5 MG/0.5ML ~~LOC~~ SOAJ: 2.5000 mg | SUBCUTANEOUS | 0 refills | Status: DC

## 2023-09-07 MED ORDER — FLUOXETINE HCL 40 MG PO CAPS: 40.0000 mg | ORAL_CAPSULE | Freq: Every day | ORAL | 3 refills | Status: AC

## 2023-09-07 NOTE — Telephone Encounter (Signed)
 Prior authorization done via cover my meds for patients Zepbound. Waiting on determination.

## 2023-09-07 NOTE — Progress Notes (Signed)
 WEIGHT SUMMARY AND BIOMETRICS  Vitals Temp: 98.9 F (37.2 C) BP: 119/82 Pulse Rate: 66 SpO2: 98 %   Anthropometric Measurements Height: 5\' 7"  (1.702 m) Weight: 249 lb (112.9 kg) BMI (Calculated): 38.99 Weight at Last Visit: 247 lb Weight Lost Since Last Visit: 0 Weight Gained Since Last Visit: 2 lb Starting Weight: 268 lb Total Weight Loss (lbs): 11 lb (4.99 kg) Peak Weight: 272 lb   Body Composition  Body Fat %: 46.2 % Fat Mass (lbs): 115.4 lbs Muscle Mass (lbs): 127.4 lbs Total Body Water (lbs): 93.6 lbs Visceral Fat Rating : 13   Other Clinical Data RMR: 2002 Fasting: yes Labs: no Today's Visit #: 7 Starting Date: 05/11/23    Chief Complaint:   OBESITY Holly Johnson is here to discuss her progress with her obesity treatment plan.  She is on the the Category 2 Plan and states she is following her eating plan approximately 50 % of the time.  She states she is exercising: NEAT Activities  Interim History:  Ms. Germain has IUD and experienced premenstrual sx's and increased sugar/simple CHO cravings Bioimpedance results reflect her water weight increased by +3.8 lbs She denies dyspnea  She and her family enjoyed their beach vacation!  Subjective:   1. Pure hypertriglyceridemia Discussed Labs Lipid Panel     Component Value Date/Time   CHOL 194 08/16/2023 1022   TRIG 111 08/16/2023 1022   HDL 41 08/16/2023 1022   CHOLHDL 4.7 (H) 08/16/2023 1022   LDLCALC 133 (H) 08/16/2023 1022   LABVLDL 20 08/16/2023 1022   The 10-year ASCVD risk score (Arnett DK, et al., 2019) is: 1.3%   Values used to calculate the score:     Age: 24 years     Sex: Female     Is Non-Hispanic African American: No     Diabetic: No     Tobacco smoker: No     Systolic Blood Pressure: 119 mmHg     Is BP treated: No     HDL Cholesterol: 41 mg/dL     Total Cholesterol: 194 mg/dL   Lipid panel stable and ASCVD Risk Stratification is acceptable She is not on statin therapy  2.  B12 deficiency - new onset Discussed Labs Vitamin B12 232 - 1,245 pg/mL  643   She has been consistently taking daily oral B12 500mcg B12 level is at goal  3. Vitamin D  deficiency Discussed Labs  Latest Reference Range & Units 08/16/23 10:22 08/16/23 10:23  Vitamin D , 25-Hydroxy 30.0 - 100.0 ng/mL 70.5         Vit D Level at goal She is on daily OTC Vit D3 5,000 international units   4. Prediabetes - new onset Discussed Labs  Latest Reference Range & Units 08/16/23 10:22  Glucose 70 - 99 mg/dL 89  Hemoglobin Z6X 4.8 - 5.6 % 5.6  Est. average glucose Bld gHb Est-mCnc mg/dL 096  INSULIN  2.6 - 24.9 uIU/mL 16.6   CBG and A1c at goal Insulin  level worsened  She has been on Wegovy  therapy in past, last use June 2024 at max dose weekly 2.4mg  She reports tolerating GLP-1 therapy well. She feels that she experienced minimal weight loss with Wegovy , as she did not alter eating habits and increase regular exercise when taking. She denies family hx of MENS 2 or MTC She denies personal hx of pancreatitis Birth Control- IUD  Risks/benefits of GLP-1 and GIP/GLP-1 therapy, she is agreeable to start weekly Zepbound therapy  5. Other  fatigue Discussed Labs  Latest Reference Range & Units 08/16/23 10:22 08/16/23 10:23  Vitamin D , 25-Hydroxy 30.0 - 100.0 ng/mL 70.5   Vitamin B12 232 - 1,245 pg/mL  643   PCP manages daily SYNTHROID  88 MCG tablet  Vit D level and B12 levels both stable and at goal  6. GAD (generalized anxiety disorder) She has titrated up to Fluoxetine  40mg  daily She endorses stable mood, denies SI/HI  Assessment/Plan:   1. Pure hypertriglyceridemia Increase regular exercise  2. B12 deficiency - new onset Continue daily OTC oral B12 500 mcg  3. Vitamin D  deficiency Continue daily OTC Vit D3 5,000 international units   4. Prediabetes - new onset Start  tirzepatide (ZEPBOUND) 2.5 MG/0.5ML Pen Inject 2.5 mg into the skin once a week. Dispense: 3 mL, Refills: 0  ordered   5. Other fatigue (Primary) Continue daily oral Vit D3 5,000 international units and oral B12 500mcg  6. GAD (generalized anxiety disorder) Continue daily SSRI Increase regular exercise  7. BMI 40.0-44.9, adult (HCC) - Current BMI 39.1 Start  tirzepatide (ZEPBOUND) 2.5 MG/0.5ML Pen Inject 2.5 mg into the skin once a week. Dispense: 3 mL, Refills: 0 ordered   Holly Johnson is currently in the action stage of change. As such, her goal is to continue with weight loss efforts. She has agreed to the Category 2 Plan.   Exercise goals: All adults should avoid inactivity. Some physical activity is better than none, and adults who participate in any amount of physical activity gain some health benefits. Adults should also include muscle-strengthening activities that involve all major muscle groups on 2 or more days a week.  Behavioral modification strategies: increasing lean protein intake, decreasing simple carbohydrates, increasing vegetables, increasing water intake, meal planning and cooking strategies, keeping healthy foods in the home, ways to avoid boredom eating, ways to avoid night time snacking, and planning for success.  Jakki has agreed to follow-up with our clinic in 4 weeks. She was informed of the importance of frequent follow-up visits to maximize her success with intensive lifestyle modifications for her multiple health conditions.   Objective:   Blood pressure 119/82, pulse 66, temperature 98.9 F (37.2 C), height 5\' 7"  (1.702 m), weight 249 lb (112.9 kg), SpO2 98%. Body mass index is 39 kg/m.  General: Cooperative, alert, well developed, in no acute distress. HEENT: Conjunctivae and lids unremarkable. Cardiovascular: Regular rhythm.  Lungs: Normal work of breathing. Neurologic: No focal deficits.   Lab Results  Component Value Date   CREATININE 0.93 08/16/2023   BUN 15 08/16/2023   NA 141 08/16/2023   K 4.1 08/16/2023   CL 104 08/16/2023   CO2 22 08/16/2023    Lab Results  Component Value Date   ALT 11 08/16/2023   AST 12 08/16/2023   ALKPHOS 62 08/16/2023   BILITOT 0.5 08/16/2023   Lab Results  Component Value Date   HGBA1C 5.6 08/16/2023   HGBA1C 5.9 (H) 05/11/2023   Lab Results  Component Value Date   INSULIN  16.6 08/16/2023   INSULIN  7.1 05/11/2023   Lab Results  Component Value Date   TSH 2.910 05/11/2023   Lab Results  Component Value Date   CHOL 194 08/16/2023   HDL 41 08/16/2023   LDLCALC 133 (H) 08/16/2023   TRIG 111 08/16/2023   CHOLHDL 4.7 (H) 08/16/2023   Lab Results  Component Value Date   VD25OH 70.5 08/16/2023   VD25OH 67.7 05/11/2023   VD25OH 56.2 06/24/2022   Lab Results  Component  Value Date   WBC 6.6 05/11/2023   HGB 14.0 05/11/2023   HCT 42.3 05/11/2023   MCV 91 05/11/2023   PLT 249 05/11/2023   Lab Results  Component Value Date   IRON 123 12/24/2020   TIBC 326 12/24/2020   FERRITIN 138 12/24/2020   Attestation Statements:   Reviewed by clinician on day of visit: allergies, medications, problem list, medical history, surgical history, family history, social history, and previous encounter notes.  I have reviewed the above documentation for accuracy and completeness, and I agree with the above. -  Norman Bier d. Nishan Ovens, NP-C

## 2023-09-07 NOTE — Telephone Encounter (Signed)
 Message from Plan Request Reference Number: [id number]. ZEPBOUND INJ 2.5/0.5 is approved through 03/08/2024. Your patient may now fill this prescription and it will be covered.. Authorization Expiration Date: March 08, 2024.

## 2023-09-08 DIAGNOSIS — M25652 Stiffness of left hip, not elsewhere classified: Secondary | ICD-10-CM | POA: Diagnosis not present

## 2023-09-08 DIAGNOSIS — R2689 Other abnormalities of gait and mobility: Secondary | ICD-10-CM | POA: Diagnosis not present

## 2023-09-08 DIAGNOSIS — M25552 Pain in left hip: Secondary | ICD-10-CM | POA: Diagnosis not present

## 2023-09-10 DIAGNOSIS — R2689 Other abnormalities of gait and mobility: Secondary | ICD-10-CM | POA: Diagnosis not present

## 2023-09-10 DIAGNOSIS — M25552 Pain in left hip: Secondary | ICD-10-CM | POA: Diagnosis not present

## 2023-09-10 DIAGNOSIS — M25652 Stiffness of left hip, not elsewhere classified: Secondary | ICD-10-CM | POA: Diagnosis not present

## 2023-09-13 DIAGNOSIS — M25552 Pain in left hip: Secondary | ICD-10-CM | POA: Diagnosis not present

## 2023-09-13 DIAGNOSIS — R2689 Other abnormalities of gait and mobility: Secondary | ICD-10-CM | POA: Diagnosis not present

## 2023-09-13 DIAGNOSIS — M25652 Stiffness of left hip, not elsewhere classified: Secondary | ICD-10-CM | POA: Diagnosis not present

## 2023-09-15 ENCOUNTER — Telehealth: Admitting: Family Medicine

## 2023-09-15 DIAGNOSIS — M25552 Pain in left hip: Secondary | ICD-10-CM | POA: Diagnosis not present

## 2023-09-15 DIAGNOSIS — R2689 Other abnormalities of gait and mobility: Secondary | ICD-10-CM | POA: Diagnosis not present

## 2023-09-15 DIAGNOSIS — M25652 Stiffness of left hip, not elsewhere classified: Secondary | ICD-10-CM | POA: Diagnosis not present

## 2023-09-21 DIAGNOSIS — M25552 Pain in left hip: Secondary | ICD-10-CM | POA: Diagnosis not present

## 2023-09-21 DIAGNOSIS — M25652 Stiffness of left hip, not elsewhere classified: Secondary | ICD-10-CM | POA: Diagnosis not present

## 2023-09-21 DIAGNOSIS — R2689 Other abnormalities of gait and mobility: Secondary | ICD-10-CM | POA: Diagnosis not present

## 2023-09-22 DIAGNOSIS — F411 Generalized anxiety disorder: Secondary | ICD-10-CM | POA: Diagnosis not present

## 2023-09-23 DIAGNOSIS — M25652 Stiffness of left hip, not elsewhere classified: Secondary | ICD-10-CM | POA: Diagnosis not present

## 2023-09-23 DIAGNOSIS — R2689 Other abnormalities of gait and mobility: Secondary | ICD-10-CM | POA: Diagnosis not present

## 2023-09-23 DIAGNOSIS — M25552 Pain in left hip: Secondary | ICD-10-CM | POA: Diagnosis not present

## 2023-09-28 DIAGNOSIS — R2689 Other abnormalities of gait and mobility: Secondary | ICD-10-CM | POA: Diagnosis not present

## 2023-09-28 DIAGNOSIS — M25652 Stiffness of left hip, not elsewhere classified: Secondary | ICD-10-CM | POA: Diagnosis not present

## 2023-09-28 DIAGNOSIS — M25552 Pain in left hip: Secondary | ICD-10-CM | POA: Diagnosis not present

## 2023-09-30 ENCOUNTER — Other Ambulatory Visit: Payer: Self-pay | Admitting: Family Medicine

## 2023-09-30 DIAGNOSIS — E039 Hypothyroidism, unspecified: Secondary | ICD-10-CM

## 2023-10-04 ENCOUNTER — Ambulatory Visit (INDEPENDENT_AMBULATORY_CARE_PROVIDER_SITE_OTHER): Admitting: Adult Health

## 2023-10-04 NOTE — Telephone Encounter (Signed)
 Requested Prescriptions  Pending Prescriptions Disp Refills   SYNTHROID  88 MCG tablet [Pharmacy Med Name: SYNTHROID     TAB 88MCG] 90 tablet 0    Sig: TAKE 1 TABLET (88 MCG TOTAL) DAILY BEFORE BREAKFAST ON AN EMPTY STOMACH WAIT 30 MINUTES BEFORE TAKING OTHER MEDS FOR HYPOTHYROIDISM     Endocrinology:  Hypothyroid Agents Passed - 10/04/2023  2:09 PM      Passed - TSH in normal range and within 360 days    TSH  Date Value Ref Range Status  05/11/2023 2.910 0.450 - 4.500 uIU/mL Final         Passed - Valid encounter within last 12 months    Recent Outpatient Visits           2 months ago Healthcare maintenance   [facility] at [facility], Selinda PARAS, MD

## 2023-10-11 DIAGNOSIS — F411 Generalized anxiety disorder: Secondary | ICD-10-CM | POA: Diagnosis not present

## 2023-10-12 DIAGNOSIS — M25652 Stiffness of left hip, not elsewhere classified: Secondary | ICD-10-CM | POA: Diagnosis not present

## 2023-10-12 DIAGNOSIS — R2689 Other abnormalities of gait and mobility: Secondary | ICD-10-CM | POA: Diagnosis not present

## 2023-10-12 DIAGNOSIS — M25552 Pain in left hip: Secondary | ICD-10-CM | POA: Diagnosis not present

## 2023-10-20 DIAGNOSIS — M25552 Pain in left hip: Secondary | ICD-10-CM | POA: Diagnosis not present

## 2023-10-20 DIAGNOSIS — R2689 Other abnormalities of gait and mobility: Secondary | ICD-10-CM | POA: Diagnosis not present

## 2023-10-20 DIAGNOSIS — M25652 Stiffness of left hip, not elsewhere classified: Secondary | ICD-10-CM | POA: Diagnosis not present

## 2023-10-25 ENCOUNTER — Telehealth (INDEPENDENT_AMBULATORY_CARE_PROVIDER_SITE_OTHER): Payer: Self-pay | Admitting: *Deleted

## 2023-10-25 ENCOUNTER — Ambulatory Visit (INDEPENDENT_AMBULATORY_CARE_PROVIDER_SITE_OTHER): Admitting: Adult Health

## 2023-10-25 ENCOUNTER — Encounter (INDEPENDENT_AMBULATORY_CARE_PROVIDER_SITE_OTHER): Payer: Self-pay | Admitting: Adult Health

## 2023-10-25 VITALS — BP 116/77 | HR 59 | Temp 97.7°F | Ht 67.0 in | Wt 244.0 lb

## 2023-10-25 DIAGNOSIS — E538 Deficiency of other specified B group vitamins: Secondary | ICD-10-CM | POA: Diagnosis not present

## 2023-10-25 DIAGNOSIS — E781 Pure hyperglyceridemia: Secondary | ICD-10-CM | POA: Diagnosis not present

## 2023-10-25 DIAGNOSIS — R7303 Prediabetes: Secondary | ICD-10-CM

## 2023-10-25 DIAGNOSIS — Z6841 Body Mass Index (BMI) 40.0 and over, adult: Secondary | ICD-10-CM

## 2023-10-25 DIAGNOSIS — Z6838 Body mass index (BMI) 38.0-38.9, adult: Secondary | ICD-10-CM

## 2023-10-25 DIAGNOSIS — E559 Vitamin D deficiency, unspecified: Secondary | ICD-10-CM | POA: Diagnosis not present

## 2023-10-25 DIAGNOSIS — E669 Obesity, unspecified: Secondary | ICD-10-CM

## 2023-10-25 MED ORDER — ZEPBOUND 5 MG/0.5ML ~~LOC~~ SOAJ: 5.0000 mg | SUBCUTANEOUS | 0 refills | Status: DC

## 2023-10-25 NOTE — Progress Notes (Signed)
 WEIGHT SUMMARY AND BIOMETRICS  Vitals Temp: 97.7 F (36.5 C) BP: 116/77 Pulse Rate: (!) 59 SpO2: 99 %   Anthropometric Measurements Height: 5' 7 (1.702 m) Weight: 244 lb (110.7 kg) BMI (Calculated): 38.21 Weight at Last Visit: 249 lb Weight Lost Since Last Visit: 5 lb Weight Gained Since Last Visit: 0 Starting Weight: 268 lb Total Weight Loss (lbs): 24 lb (10.9 kg) Peak Weight: 272 lb   Body Composition  Body Fat %: 46.2 % Fat Mass (lbs): 112.8 lbs Muscle Mass (lbs): 124.8 lbs Total Body Water (lbs): 91.4 lbs Visceral Fat Rating : 13   Other Clinical Data RMR: 2002 Fasting: yes Labs: no Today's Visit #: 8 Starting Date: 05/11/23    Chief Complaint:   OBESITY Holly Johnson is here to discuss her progress with her obesity treatment plan.  She is on the the Category 2 Plan and states she is following her eating plan approximately 25 % of the time.  She states she is exercising NEAT Activities.  Interim History:  She started weekly Zepbound  2.5mg  on/about 09/08/2023 She has had 4 doses Denies mass in neck, dysphagia, dyspepsia, persistent hoarseness, abdominal pain, or worsening Constipation. She will occasionally experience nausea without vomiting  She reports decreased cravings.  PA for Zebpound therapy secured through end of 2025  Subjective:   1. Pure hypertriglyceridemia Lipid Panel     Component Value Date/Time   CHOL 194 08/16/2023 1022   TRIG 111 08/16/2023 1022   HDL 41 08/16/2023 1022   CHOLHDL 4.7 (H) 08/16/2023 1022   LDLCALC 133 (H) 08/16/2023 1022   LABVLDL 20 08/16/2023 1022    She started weekly Zepbound  2.5mg  on/about 09/08/2023 She has had 4 doses Denies mass in neck, dysphagia, dyspepsia, persistent hoarseness, abdominal pain, or worsening Constipation. She will occasionally experience nausea without vomiting  2. B12 deficiency - new onset  Latest Reference Range & Units 05/11/23 11:09 08/16/23 10:23  Vitamin B12 232 - 1,245  pg/mL 289 643   She is on daily OTC B12 500 mcg  3. Vitamin D  deficiency  Latest Reference Range & Units 06/24/22 08:49 05/11/23 11:09 08/16/23 10:22  Vitamin D , 25-Hydroxy 30.0 - 100.0 ng/mL 56.2 67.7 70.5   Vit D level stable and at goal She is on daily OTC Vit D 5,000 international units  4. Prediabetes - new onset Lab Results  Component Value Date   HGBA1C 5.6 08/16/2023   HGBA1C 5.9 (H) 05/11/2023     Latest Reference Range & Units 05/11/23 11:09 08/16/23 10:22  INSULIN  2.6 - 24.9 uIU/mL 7.1 16.6   She started weekly Zepbound  2.5mg  on/about 09/08/2023 She has had 4 doses Denies mass in neck, dysphagia, dyspepsia, persistent hoarseness, abdominal pain, or worsening Constipation. She will occasionally experience nausea without vomiting  Assessment/Plan:   1. Pure hypertriglyceridemia (Primary) Limit sat fat intake and increase regular cardiovascular exercise  2. B12 deficiency - new onset Continue current OTC supplementation  3. Vitamin D  deficiency Continue current OTC supplementation  4. Prediabetes - new onset Increase plan compliance on Cat 2 MP, at least 80% Strive for 30g protein per meal  5. BMI 40.0-44.9, adult (HCC) - Current BMI 38.2 Refill and INCREASE []   tirzepatide  (ZEPBOUND ) 5 MG/0.5ML Pen Inject 5 mg into the skin once a week. Dispense: 2 mL, Refills: 0 ordered   Holly Johnson is not currently in the action stage of change. As such, her goal is to get back to weightloss efforts . She has agreed to the  Category 2 Plan.   Exercise goals: All adults should avoid inactivity. Some physical activity is better than none, and adults who participate in any amount of physical activity gain some health benefits. Adults should also include muscle-strengthening activities that involve all major muscle groups on 2 or more days a week.  Behavioral modification strategies: increasing lean protein intake, decreasing simple carbohydrates, increasing vegetables, increasing  water intake, no skipping meals, meal planning and cooking strategies, keeping healthy foods in the home, ways to avoid boredom eating, and planning for success.  Holly Johnson has agreed to follow-up with our clinic in 4 weeks. She was informed of the importance of frequent follow-up visits to maximize her success with intensive lifestyle modifications for her multiple health conditions.   Objective:   Blood pressure 116/77, pulse (!) 59, temperature 97.7 F (36.5 C), height 5' 7 (1.702 m), weight 244 lb (110.7 kg), SpO2 99%. Body mass index is 38.22 kg/m.  General: Cooperative, alert, well developed, in no acute distress. HEENT: Conjunctivae and lids unremarkable. Cardiovascular: Regular rhythm.  Lungs: Normal work of breathing. Neurologic: No focal deficits.   Lab Results  Component Value Date   CREATININE 0.93 08/16/2023   BUN 15 08/16/2023   NA 141 08/16/2023   K 4.1 08/16/2023   CL 104 08/16/2023   CO2 22 08/16/2023   Lab Results  Component Value Date   ALT 11 08/16/2023   AST 12 08/16/2023   ALKPHOS 62 08/16/2023   BILITOT 0.5 08/16/2023   Lab Results  Component Value Date   HGBA1C 5.6 08/16/2023   HGBA1C 5.9 (H) 05/11/2023   Lab Results  Component Value Date   INSULIN  16.6 08/16/2023   INSULIN  7.1 05/11/2023   Lab Results  Component Value Date   TSH 2.910 05/11/2023   Lab Results  Component Value Date   CHOL 194 08/16/2023   HDL 41 08/16/2023   LDLCALC 133 (H) 08/16/2023   TRIG 111 08/16/2023   CHOLHDL 4.7 (H) 08/16/2023   Lab Results  Component Value Date   VD25OH 70.5 08/16/2023   VD25OH 67.7 05/11/2023   VD25OH 56.2 06/24/2022   Lab Results  Component Value Date   WBC 6.6 05/11/2023   HGB 14.0 05/11/2023   HCT 42.3 05/11/2023   MCV 91 05/11/2023   PLT 249 05/11/2023   Lab Results  Component Value Date   IRON 123 12/24/2020   TIBC 326 12/24/2020   FERRITIN 138 12/24/2020   Attestation Statements:   Reviewed by clinician on day of  visit: allergies, medications, problem list, medical history, surgical history, family history, social history, and previous encounter notes.  I have reviewed the above documentation for accuracy and completeness, and I agree with the above. -  Holly Sippel d. Bora Broner, NP-C

## 2023-10-25 NOTE — Telephone Encounter (Signed)
 Medication for Zepbound -5 mg/0.5 ml pen has been sent to plan for prior authorization, waiting on response.

## 2023-10-26 DIAGNOSIS — R2689 Other abnormalities of gait and mobility: Secondary | ICD-10-CM | POA: Diagnosis not present

## 2023-10-26 DIAGNOSIS — M25552 Pain in left hip: Secondary | ICD-10-CM | POA: Diagnosis not present

## 2023-10-26 DIAGNOSIS — F411 Generalized anxiety disorder: Secondary | ICD-10-CM | POA: Diagnosis not present

## 2023-10-26 DIAGNOSIS — M25652 Stiffness of left hip, not elsewhere classified: Secondary | ICD-10-CM | POA: Diagnosis not present

## 2023-11-04 ENCOUNTER — Telehealth (INDEPENDENT_AMBULATORY_CARE_PROVIDER_SITE_OTHER): Payer: Self-pay | Admitting: *Deleted

## 2023-11-04 NOTE — Telephone Encounter (Signed)
 Message from Plan This medication or product was previously approved on [id number] from 2023-09-07 to 2024-03-08. **Please note: This request was submitted electronically. Formulary lowering, tiering exception, cost reduction and/or pre-benefit determination review (including prospective Medicare hospice reviews) requests cannot be requested using this method of submission. Providers contact us  at [phone] for further assistance. Patient has been updated thru MyChart.

## 2023-11-17 DIAGNOSIS — F411 Generalized anxiety disorder: Secondary | ICD-10-CM | POA: Diagnosis not present

## 2023-11-25 ENCOUNTER — Ambulatory Visit (INDEPENDENT_AMBULATORY_CARE_PROVIDER_SITE_OTHER): Admitting: Adult Health

## 2023-11-25 ENCOUNTER — Encounter (INDEPENDENT_AMBULATORY_CARE_PROVIDER_SITE_OTHER): Payer: Self-pay | Admitting: Adult Health

## 2023-11-25 VITALS — BP 122/74 | HR 66 | Temp 98.5°F | Ht 67.0 in | Wt 238.0 lb

## 2023-11-25 DIAGNOSIS — E559 Vitamin D deficiency, unspecified: Secondary | ICD-10-CM | POA: Diagnosis not present

## 2023-11-25 DIAGNOSIS — E538 Deficiency of other specified B group vitamins: Secondary | ICD-10-CM | POA: Diagnosis not present

## 2023-11-25 DIAGNOSIS — E781 Pure hyperglyceridemia: Secondary | ICD-10-CM | POA: Diagnosis not present

## 2023-11-25 DIAGNOSIS — E669 Obesity, unspecified: Secondary | ICD-10-CM

## 2023-11-25 DIAGNOSIS — Z6837 Body mass index (BMI) 37.0-37.9, adult: Secondary | ICD-10-CM

## 2023-11-25 DIAGNOSIS — R7303 Prediabetes: Secondary | ICD-10-CM

## 2023-11-25 DIAGNOSIS — Z6841 Body Mass Index (BMI) 40.0 and over, adult: Secondary | ICD-10-CM

## 2023-11-25 MED ORDER — ZEPBOUND 5 MG/0.5ML ~~LOC~~ SOAJ: 5.0000 mg | SUBCUTANEOUS | 0 refills | Status: DC

## 2023-11-26 NOTE — Progress Notes (Signed)
 WEIGHT SUMMARY AND BIOMETRICS  Vitals Temp: 98.5 F (36.9 C) BP: 122/74 Pulse Rate: 66 SpO2: 99 %   Anthropometric Measurements Height: 5' 7 (1.702 m) Weight: 238 lb (108 kg) BMI (Calculated): 37.27 Weight at Last Visit: 244 lb Weight Lost Since Last Visit: 6 lb Weight Gained Since Last Visit: 0 Starting Weight: 268 lb Total Weight Loss (lbs): 30 lb (13.6 kg) Peak Weight: 272 lb   Body Composition  Body Fat %: 45.1 % Fat Mass (lbs): 107.6 lbs Muscle Mass (lbs): 124.6 lbs Total Body Water (lbs): 90.8 lbs Visceral Fat Rating : 12   Other Clinical Data RMR: 2002 Fasting: no Labs: no Today's Visit #: 9 Starting Date: 05/11/23    Chief Complaint:   OBESITY Holly Johnson is here to discuss her progress with her obesity treatment plan.   is on the the Category 2 Plan and states she is following her eating plan approximately 50 % of the time.  She states she is exercising: None  Interim History:  Holly Johnson has resumed classes and the next month will be hectic and an adjustment period for Holly Johnson. She is married and has two children. Both children started school this week, grade 8th and 10th  Of note- She started weekly Zepbound  2.5mg  on/about 09/08/2023 10/25/2023 Zepbound  2.5mg  increased to 5 mg Denies mass in neck, dysphagia, dyspepsia, persistent hoarseness, abdominal pain, or N/V/C  She has Mirena  IUD  Subjective:   1. Prediabetes - new onset Lab Results  Component Value Date   HGBA1C 5.6 08/16/2023   HGBA1C 5.9 (H) 05/11/2023    She started weekly Zepbound  2.5mg  on/about 09/08/2023 10/25/2023 Zepbound  2.5mg  increased to 5 mg Denies mass in neck, dysphagia, dyspepsia, persistent hoarseness, abdominal pain, or N/V/C   2. Pure hypertriglyceridemia She started weekly Zepbound  2.5mg  on/about 09/08/2023 10/25/2023 Zepbound  2.5mg  increased to 5 mg Denies mass in neck, dysphagia, dyspepsia, persistent hoarseness, abdominal pain, or N/V/C  Lipid  Panel     Component Value Date/Time   CHOL 194 08/16/2023 1022   TRIG 111 08/16/2023 1022   HDL 41 08/16/2023 1022   CHOLHDL 4.7 (H) 08/16/2023 1022   LDLCALC 133 (H) 08/16/2023 1022   LABVLDL 20 08/16/2023 1022   The 10-year ASCVD risk score (Arnett DK, et al., 2019) is: 1.4%   Values used to calculate the score:     Age: 48 years     Clincally relevant sex: Female     Is Non-Hispanic African American: No     Diabetic: No     Tobacco smoker: No     Systolic Blood Pressure: 122 mmHg     Is BP treated: No     HDL Cholesterol: 41 mg/dL     Total Cholesterol: 194 mg/dL   3. B12 deficiency - new onset She is on daily oral B12 500 mcg   4. Vitamin D  deficiency She is on daily OTC Vit D3 5000 international units    Assessment/Plan:   1. Prediabetes - new onset (Primary) Refill tirzepatide  (ZEPBOUND ) 5 MG/0.5ML Pen Inject 5 mg into the skin once a week. Dispense: 2 mL, Refills: 0 ordered   2. Pure hypertriglyceridemia Refill tirzepatide  (ZEPBOUND ) 5 MG/0.5ML Pen Inject 5 mg into the skin once a week. Dispense: 2 mL, Refills: 0 ordered   3. B12 deficiency - new onset Continue daily oral supplementation  4. Vitamin D  deficiency Continue OTC supplementation  5. BMI 40.0-44.9, adult (HCC) - Current BMI 37.4 Refill tirzepatide  (ZEPBOUND ) 5 MG/0.5ML  Pen Inject 5 mg into the skin once a week. Dispense: 2 mL, Refills: 0 ordered   Holly Johnson is currently in the action stage of change. As such, her goal is to continue with weight loss efforts. She has agreed to the Category 2 Plan.   Exercise goals: All adults should avoid inactivity. Some physical activity is better than none, and adults who participate in any amount of physical activity gain some health benefits. Adults should also include muscle-strengthening activities that involve all major muscle groups on 2 or more days a week.  Behavioral modification strategies: increasing lean protein intake, decreasing simple carbohydrates,  increasing vegetables, increasing water intake, no skipping meals, meal planning and cooking strategies, keeping healthy foods in the home, and planning for success.  Holly Johnson has agreed to follow-up with our clinic in 4 weeks. She was informed of the importance of frequent follow-up visits to maximize her success with intensive lifestyle modifications for her multiple health conditions.  Objective:   Blood pressure 122/74, pulse 66, temperature 98.5 F (36.9 C), height 5' 7 (1.702 m), weight 238 lb (108 kg), SpO2 99%. Body mass index is 37.28 kg/m.  General: Cooperative, alert, well developed, in no acute distress. HEENT: Conjunctivae and lids unremarkable. Cardiovascular: Regular rhythm.  Lungs: Normal work of breathing. Neurologic: No focal deficits.   Lab Results  Component Value Date   CREATININE 0.93 08/16/2023   BUN 15 08/16/2023   NA 141 08/16/2023   K 4.1 08/16/2023   CL 104 08/16/2023   CO2 22 08/16/2023   Lab Results  Component Value Date   ALT 11 08/16/2023   AST 12 08/16/2023   ALKPHOS 62 08/16/2023   BILITOT 0.5 08/16/2023   Lab Results  Component Value Date   HGBA1C 5.6 08/16/2023   HGBA1C 5.9 (H) 05/11/2023   Lab Results  Component Value Date   INSULIN  16.6 08/16/2023   INSULIN  7.1 05/11/2023   Lab Results  Component Value Date   TSH 2.910 05/11/2023   Lab Results  Component Value Date   CHOL 194 08/16/2023   HDL 41 08/16/2023   LDLCALC 133 (H) 08/16/2023   TRIG 111 08/16/2023   CHOLHDL 4.7 (H) 08/16/2023   Lab Results  Component Value Date   VD25OH 70.5 08/16/2023   VD25OH 67.7 05/11/2023   VD25OH 56.2 06/24/2022   Lab Results  Component Value Date   WBC 6.6 05/11/2023   HGB 14.0 05/11/2023   HCT 42.3 05/11/2023   MCV 91 05/11/2023   PLT 249 05/11/2023   Lab Results  Component Value Date   IRON 123 12/24/2020   TIBC 326 12/24/2020   FERRITIN 138 12/24/2020   Attestation Statements:   Reviewed by clinician on day of visit:  allergies, medications, problem list, medical history, surgical history, family history, social history, and previous encounter notes.  I have reviewed the above documentation for accuracy and completeness, and I agree with the above. -  Vega Withrow d. Khaleelah Yowell, NP-C

## 2023-12-06 DIAGNOSIS — F411 Generalized anxiety disorder: Secondary | ICD-10-CM | POA: Diagnosis not present

## 2023-12-15 DIAGNOSIS — G4733 Obstructive sleep apnea (adult) (pediatric): Secondary | ICD-10-CM | POA: Diagnosis not present

## 2023-12-27 DIAGNOSIS — F411 Generalized anxiety disorder: Secondary | ICD-10-CM | POA: Diagnosis not present

## 2024-01-03 ENCOUNTER — Encounter (INDEPENDENT_AMBULATORY_CARE_PROVIDER_SITE_OTHER): Payer: Self-pay | Admitting: Adult Health

## 2024-01-03 ENCOUNTER — Ambulatory Visit (INDEPENDENT_AMBULATORY_CARE_PROVIDER_SITE_OTHER): Admitting: Adult Health

## 2024-01-03 VITALS — BP 139/82 | HR 59 | Temp 98.5°F | Ht 67.0 in | Wt 237.0 lb

## 2024-01-03 DIAGNOSIS — E559 Vitamin D deficiency, unspecified: Secondary | ICD-10-CM

## 2024-01-03 DIAGNOSIS — Z0000 Encounter for general adult medical examination without abnormal findings: Secondary | ICD-10-CM

## 2024-01-03 DIAGNOSIS — Z6837 Body mass index (BMI) 37.0-37.9, adult: Secondary | ICD-10-CM

## 2024-01-03 DIAGNOSIS — R7303 Prediabetes: Secondary | ICD-10-CM | POA: Diagnosis not present

## 2024-01-03 DIAGNOSIS — E781 Pure hyperglyceridemia: Secondary | ICD-10-CM | POA: Diagnosis not present

## 2024-01-03 DIAGNOSIS — E538 Deficiency of other specified B group vitamins: Secondary | ICD-10-CM | POA: Diagnosis not present

## 2024-01-03 DIAGNOSIS — E669 Obesity, unspecified: Secondary | ICD-10-CM

## 2024-01-03 DIAGNOSIS — Z6841 Body Mass Index (BMI) 40.0 and over, adult: Secondary | ICD-10-CM

## 2024-01-03 MED ORDER — ZEPBOUND 5 MG/0.5ML ~~LOC~~ SOAJ: 5.0000 mg | SUBCUTANEOUS | 0 refills | Status: DC

## 2024-01-03 NOTE — Progress Notes (Signed)
 WEIGHT SUMMARY AND BIOMETRICS  Vitals Temp: 98.5 F (36.9 C) BP: 139/82 Pulse Rate: (!) 59 SpO2: 98 %   Anthropometric Measurements Height: 5' 7 (1.702 m) Weight: 237 lb (107.5 kg) BMI (Calculated): 37.11 Weight at Last Visit: 238lb Weight Lost Since Last Visit: 1lb Weight Gained Since Last Visit: 0lb Starting Weight: 268lb Total Weight Loss (lbs): 31 lb (14.1 kg) Peak Weight: 272lb   Body Composition  Body Fat %: 44.6 % Fat Mass (lbs): 105.8 lbs Muscle Mass (lbs): 124.8 lbs Total Body Water (lbs): 87.6 lbs Visceral Fat Rating : 12   Other Clinical Data RMR: 2002 Fasting: No Labs: No Today's Visit #: 10 Starting Date: 05/11/23    Chief Complaint:   OBESITY Holly Johnson is here to discuss her progress with her obesity treatment plan.  She is on the the Category 2 Plan and states she is following her eating plan approximately 25 % of the time.  She states she is exercising: NEAT Activities  Interim History:  Her 25 year old mother had R foot orthopedic surgery on 12/26/2023. She is non weight bearing for at least 4 weeks. Mother is using wheelchair at home, unable to safely use knee scooter. Holly Johnson and her sister have been taking turns caring for their mother. Her mother lives in [place], [place] 2.5 hours from Holly Johnson' home.  She started weekly Zepbound  2.5mg  on/about 09/08/2023 10/25/2023 Zepbound  2.5mg  increased to 5 mg Denies mass in neck, dysphagia, dyspepsia, persistent hoarseness, abdominal pain. She will occasionally experience nausea without vomiting. She feels that her constipation is well controlled with hydration, fiber intake, and OTC treatments.  Subjective:   1. Prediabetes - new onset Lab Results  Component Value Date   HGBA1C 5.6 08/16/2023   HGBA1C 5.9 (H) 05/11/2023    She started weekly Zepbound  2.5mg  on/about 09/08/2023 10/25/2023 Zepbound  2.5mg  increased to 5 mg Denies mass in neck, dysphagia, dyspepsia, persistent  hoarseness, abdominal pain. She will occasionally experience nausea without vomiting. She feels that her constipation is well controlled with hydration, fiber intake, and OTC treatments.  2. Vitamin D  deficiency  Latest Reference Range & Units 08/16/23 10:22  Vitamin D , 25-Hydroxy 30.0 - 100.0 ng/mL 70.5   Vit D Level stable at last check  3. Pure hypertriglyceridemia Lipid Panel     Component Value Date/Time   CHOL 194 08/16/2023 1022   TRIG 111 08/16/2023 1022   HDL 41 08/16/2023 1022   CHOLHDL 4.7 (H) 08/16/2023 1022   LDLCALC 133 (H) 08/16/2023 1022   LABVLDL 20 08/16/2023 1022  The 10-year ASCVD risk score (Arnett DK, et al., 2019) is: 1.7%   Values used to calculate the score:     Age: 48 years     Clincally relevant sex: Female     Is Non-Hispanic African American: No     Diabetic: No     Tobacco smoker: No     Systolic Blood Pressure: 139 mmHg     Is BP treated: No     HDL Cholesterol: 41 mg/dL     Total Cholesterol: 194 mg/dL   She started weekly Zepbound  2.5mg  on/about 09/08/2023 10/25/2023 Zepbound  2.5mg  increased to 5 mg Denies mass in neck, dysphagia, dyspepsia, persistent hoarseness, abdominal pain. She will occasionally experience nausea without vomiting. She feels that her constipation is well controlled with hydration, fiber intake, and OTC treatments.  She is not on statin therapy  4. B12 deficiency - new onset  Latest Reference Range & Units 12/24/20  09:32 05/11/23 11:09 08/16/23 10:23  Vitamin B12 232 - 1,245 pg/mL 314 289 643   She is on daily oral B12 500 mcg  5. Healthcare maintenance She started weekly Zepbound  2.5mg  on/about 09/08/2023 10/25/2023 Zepbound  2.5mg  increased to 5 mg Denies mass in neck, dysphagia, dyspepsia, persistent hoarseness, abdominal pain. She will occasionally experience nausea without vomiting. She feels that her constipation is well controlled with hydration, fiber intake, and OTC treatments.  She is on daily oral B12  500 mcg  Assessment/Plan:   1. Prediabetes - new onset (Primary) Check Labs - Hemoglobin A1c - Insulin , random Refill  tirzepatide  (ZEPBOUND ) 5 MG/0.5ML Pen Inject 5 mg into the skin once a week. Dispense: 2 mL, Refills: 0 ordered   2. Vitamin D  deficiency Check Labs - Vitamin D  (25 hydroxy)  3. Pure hypertriglyceridemia Check Labs - Lipid panel - Comprehensive metabolic panel with GFR  4. B12 deficiency - new onset Check Labs - Vitamin B12  5. Healthcare maintenance Check Labs - TSH + free T4 - Vitamin B12  5. BMI 40.0-44.9, adult (HCC) - Current BMI 37.2 Refill  tirzepatide  (ZEPBOUND ) 5 MG/0.5ML Pen Inject 5 mg into the skin once a week. Dispense: 2 mL, Refills: 0 ordered   Holly Johnson is currently in the action stage of change. As such, her goal is to continue with weight loss efforts. She has agreed to the Category 2 Plan.   Exercise goals: All adults should avoid inactivity. Some physical activity is better than none, and adults who participate in any amount of physical activity gain some health benefits. Adults should also include muscle-strengthening activities that involve all major muscle groups on 2 or more days a week.  Behavioral modification strategies: increasing lean protein intake, decreasing simple carbohydrates, increasing vegetables, increasing water intake, no skipping meals, meal planning and cooking strategies, keeping healthy foods in the home, ways to avoid boredom eating, and planning for success.  Holly Johnson has agreed to follow-up with our clinic in 4 weeks. She was informed of the importance of frequent follow-up visits to maximize her success with intensive lifestyle modifications for her multiple health conditions.   Holly Johnson was informed we would discuss her lab results at her next visit unless there is a critical issue that needs to be addressed sooner. Holly Johnson agreed to keep her next visit at the agreed upon time to discuss these  results.  Objective:   Blood pressure 139/82, pulse (!) 59, temperature 98.5 F (36.9 C), height 5' 7 (1.702 m), weight 237 lb (107.5 kg), SpO2 98%. Body mass index is 37.12 kg/m.  General: Cooperative, alert, well developed, in no acute distress. HEENT: Conjunctivae and lids unremarkable. Cardiovascular: Regular rhythm.  Lungs: Normal work of breathing. Neurologic: No focal deficits.   Lab Results  Component Value Date   CREATININE 0.93 08/16/2023   BUN 15 08/16/2023   NA 141 08/16/2023   K 4.1 08/16/2023   CL 104 08/16/2023   CO2 22 08/16/2023   Lab Results  Component Value Date   ALT 11 08/16/2023   AST 12 08/16/2023   ALKPHOS 62 08/16/2023   BILITOT 0.5 08/16/2023   Lab Results  Component Value Date   HGBA1C 5.6 08/16/2023   HGBA1C 5.9 (H) 05/11/2023   Lab Results  Component Value Date   INSULIN  16.6 08/16/2023   INSULIN  7.1 05/11/2023   Lab Results  Component Value Date   TSH 2.910 05/11/2023   Lab Results  Component Value Date   CHOL 194 08/16/2023  HDL 41 08/16/2023   LDLCALC 133 (H) 08/16/2023   TRIG 111 08/16/2023   CHOLHDL 4.7 (H) 08/16/2023   Lab Results  Component Value Date   VD25OH 70.5 08/16/2023   VD25OH 67.7 05/11/2023   VD25OH 56.2 06/24/2022   Lab Results  Component Value Date   WBC 6.6 05/11/2023   HGB 14.0 05/11/2023   HCT 42.3 05/11/2023   MCV 91 05/11/2023   PLT 249 05/11/2023   Lab Results  Component Value Date   IRON 123 12/24/2020   TIBC 326 12/24/2020   FERRITIN 138 12/24/2020   Attestation Statements:   Reviewed by clinician on day of visit: allergies, medications, problem list, medical history, surgical history, family history, social history, and previous encounter notes.  I have reviewed the above documentation for accuracy and completeness, and I agree with the above. -  Remedios Mckone d. Tucker Minter, NP-C

## 2024-01-04 LAB — HEMOGLOBIN A1C
Est. average glucose Bld gHb Est-mCnc: 105 mg/dL
Hgb A1c MFr Bld: 5.3 % (ref 4.8–5.6)

## 2024-01-04 LAB — COMPREHENSIVE METABOLIC PANEL WITH GFR
ALT: 13 IU/L (ref 0–32)
AST: 11 IU/L (ref 0–40)
Albumin: 4.5 g/dL (ref 3.9–4.9)
Alkaline Phosphatase: 63 IU/L (ref 41–116)
BUN/Creatinine Ratio: 13 (ref 9–23)
BUN: 13 mg/dL (ref 6–24)
Bilirubin Total: 0.5 mg/dL (ref 0.0–1.2)
CO2: 22 mmol/L (ref 20–29)
Calcium: 9.7 mg/dL (ref 8.7–10.2)
Chloride: 101 mmol/L (ref 96–106)
Creatinine, Ser: 0.97 mg/dL (ref 0.57–1.00)
Globulin, Total: 2.1 g/dL (ref 1.5–4.5)
Glucose: 76 mg/dL (ref 70–99)
Potassium: 4.2 mmol/L (ref 3.5–5.2)
Sodium: 139 mmol/L (ref 134–144)
Total Protein: 6.6 g/dL (ref 6.0–8.5)
eGFR: 72 mL/min/1.73 (ref >59)

## 2024-01-04 LAB — VITAMIN B12: Vitamin B-12: 870 pg/mL (ref 232–1245)

## 2024-01-04 LAB — LIPID PANEL
Chol/HDL Ratio: 4.3 ratio (ref 0.0–4.4)
Cholesterol, Total: 208 mg/dL — ABNORMAL HIGH (ref 100–199)
HDL: 48 mg/dL (ref >39)
LDL Chol Calc (NIH): 140 mg/dL — ABNORMAL HIGH (ref 0–99)
Triglycerides: 111 mg/dL (ref 0–149)
VLDL Cholesterol Cal: 20 mg/dL (ref 5–40)

## 2024-01-04 LAB — TSH+FREE T4
Free T4: 1.53 ng/dL (ref 0.82–1.77)
TSH: 3.38 u[IU]/mL (ref 0.450–4.500)

## 2024-01-04 LAB — VITAMIN D 25 HYDROXY (VIT D DEFICIENCY, FRACTURES): Vit D, 25-Hydroxy: 55.7 ng/mL (ref 30.0–100.0)

## 2024-01-04 LAB — INSULIN, RANDOM: INSULIN: 14.6 u[IU]/mL (ref 2.6–24.9)

## 2024-01-31 ENCOUNTER — Ambulatory Visit (INDEPENDENT_AMBULATORY_CARE_PROVIDER_SITE_OTHER): Payer: Self-pay | Admitting: Adult Health

## 2024-01-31 ENCOUNTER — Telehealth (INDEPENDENT_AMBULATORY_CARE_PROVIDER_SITE_OTHER): Payer: Self-pay

## 2024-01-31 ENCOUNTER — Encounter (INDEPENDENT_AMBULATORY_CARE_PROVIDER_SITE_OTHER): Payer: Self-pay | Admitting: Adult Health

## 2024-01-31 VITALS — BP 134/82 | HR 67 | Temp 98.2°F | Ht 67.0 in | Wt 238.0 lb

## 2024-01-31 DIAGNOSIS — E782 Mixed hyperlipidemia: Secondary | ICD-10-CM

## 2024-01-31 DIAGNOSIS — Z6837 Body mass index (BMI) 37.0-37.9, adult: Secondary | ICD-10-CM

## 2024-01-31 DIAGNOSIS — E538 Deficiency of other specified B group vitamins: Secondary | ICD-10-CM | POA: Diagnosis not present

## 2024-01-31 DIAGNOSIS — E559 Vitamin D deficiency, unspecified: Secondary | ICD-10-CM

## 2024-01-31 DIAGNOSIS — Z6841 Body Mass Index (BMI) 40.0 and over, adult: Secondary | ICD-10-CM

## 2024-01-31 DIAGNOSIS — R7303 Prediabetes: Secondary | ICD-10-CM

## 2024-01-31 DIAGNOSIS — E669 Obesity, unspecified: Secondary | ICD-10-CM

## 2024-01-31 MED ORDER — ZEPBOUND 7.5 MG/0.5ML ~~LOC~~ SOAJ: 7.5000 mg | SUBCUTANEOUS | 0 refills | Status: DC

## 2024-01-31 MED ORDER — TIRZEPATIDE 7.5 MG/0.5ML ~~LOC~~ SOAJ: 7.5000 mg | SUBCUTANEOUS | 0 refills | Status: DC

## 2024-01-31 NOTE — Progress Notes (Signed)
 WEIGHT SUMMARY AND BIOMETRICS  Vitals Temp: 98.2 F (36.8 C) BP: 134/82 Pulse Rate: 67 SpO2: 98 %   Anthropometric Measurements Height: 5' 7 (1.702 m) Weight: 238 lb (108 kg) BMI (Calculated): 37.27 Weight at Last Visit: 237lb Weight Lost Since Last Visit: 0lb Weight Gained Since Last Visit: 1lb Starting Weight: 268lb Total Weight Loss (lbs): 30 lb (13.6 kg) Peak Weight: 272lb   Body Composition  Body Fat %: 45 % Fat Mass (lbs): 107.2 lbs Muscle Mass (lbs): 124.4 lbs Total Body Water (lbs): 89.6 lbs Visceral Fat Rating : 12   Other Clinical Data RMR: 2002 Fasting: yes Labs: No Today's Visit #: 11 Starting Date: 05/11/23    Chief Complaint:   OBESITY Holly Johnson is here to discuss her progress with her obesity treatment plan.  She is on the the Category 2 Plan and states she is following her eating plan approximately 25 % of the time.  She states she is exercising: NEAT Activities  Interim History:  She started weekly Zepbound  2.5mg  on/about 09/08/2023 10/25/2023 Zepbound  2.5mg  increased to 5 mg  Denies mass in neck, dysphagia, dyspepsia, persistent hoarseness, abdominal pain, or N/V/Worsening Constipation.  She has been consuming more simple CHO/sugary foods. 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  She lives with her husband a two teenage daughters  Subjective:   1. Mixed hyperlipidemia [facility] Lipid Panel     Component Value Date/Time   CHOL 208 (H) 01/03/2024 0927   TRIG 111 01/03/2024 0927   HDL 48 01/03/2024 0927   CHOLHDL 4.3 01/03/2024 0927   LDLCALC 140 (H) 01/03/2024 0927   LABVLDL 20 01/03/2024 0927   The 10-year ASCVD risk score (Arnett DK, et al., 2019) is: 1.5%   Values used to calculate the score:     Age: 48 years     Clincally relevant sex:  Female     Is Non-Hispanic African American: No     Diabetic: No     Tobacco smoker: No     Systolic Blood Pressure: 134 mmHg     Is BP treated: No     HDL Cholesterol: 48 mg/dL     Total Cholesterol: 208 mg/dL   Tot and LDL both worsened, however ASCVD Risk Stratification is acceptable. Pertinent family hx: Both of her parents have HLD- treated with statin therapy  2. Vitamin D  deficiency [facility]  Latest Reference Range & Units 01/03/24 09:27  Vitamin D , 25-Hydroxy 30.0 - 100.0 ng/mL 55.7   Vit D Level stable and at goal She is on daily OTC Vit D3 5000  3. Prediabetes - new onset [facility]  Latest Reference Range & Units 01/03/24 09:27  Glucose 70 - 99 mg/dL 76  Hemoglobin J8R 4.8 - 5.6 % 5.3  Est. average glucose Bld gHb Est-mCnc mg/dL 894  INSULIN  2.6 - 24.9 uIU/mL 14.6   CBG and A1c at goal Insulin  Level decreased, however still above goal of 5  She started weekly Zepbound  2.5mg  on/about 09/08/2023 10/25/2023 Zepbound  2.5mg  increased to 5 mg  Denies mass in neck, dysphagia, dyspepsia, persistent hoarseness, abdominal pain, or N/V/Worsening Constipation.  She has been consuming more simple CHO/sugary foods. She is agreeable to increasing GIP/GLP-1 Zepbound  dosage from 5mg  to 7.5mg   4. B12 deficiency - new onset [facility]   Latest  Reference Range & Units 01/03/24 09:27  Vitamin B12 232 - 1,245 pg/mL 870   B12 level at goal  Assessment/Plan:   1. Mixed hyperlipidemia Reduce Sat fat intake Increase regular cardiovascular exercise  2. Vitamin D  deficiency Continue daily OTC supplementation  3. Prediabetes - new onset (Primary) Reduce simple CHO/sugar intake Increase lean protein intake Increase regular cardiovascular exercise  4. B12 deficiency - new onset  5. BMI 40.0-44.9, adult (HCC) - Current BMI 37.3 Refill and INCREASE tirzepatide  (ZEPBOUND ) 7.5 MG/0.5ML Pen Inject 7.5 mg into the skin once a week. Dispense: 6 mL, Refills: 0 ordered    Holly Johnson is currently in the action stage of change. As such, her goal is to continue with weight loss efforts. She has agreed to the Category 2 Plan.   Exercise goals: All adults should avoid inactivity. Some physical activity is better than none, and adults who participate in any amount of physical activity gain some health benefits. Adults should also include muscle-strengthening activities that involve all major muscle groups on 2 or more days a week.  Behavioral modification strategies: increasing lean protein intake, decreasing simple carbohydrates, increasing vegetables, increasing water intake, no skipping meals, meal planning and cooking strategies, keeping healthy foods in the home, ways to avoid boredom eating, and planning for success.  Holly Johnson has agreed to follow-up with our clinic in 4 weeks. She was informed of the importance of frequent follow-up visits to maximize her success with intensive lifestyle modifications for her multiple health conditions.   Objective:   Blood pressure 134/82, pulse 67, temperature 98.2 F (36.8 C), height 5' 7 (1.702 m), weight 238 lb (108 kg), SpO2 98%. Body mass index is 37.28 kg/m.  General: Cooperative, alert, well developed, in no acute distress. HEENT: Conjunctivae and lids unremarkable. Cardiovascular: Regular rhythm.  Lungs: Normal work of breathing. Neurologic: No focal deficits.   Lab Results  Component Value Date   CREATININE 0.97 01/03/2024   BUN 13 01/03/2024   NA 139 01/03/2024   K 4.2 01/03/2024   CL 101 01/03/2024   CO2 22 01/03/2024   Lab Results  Component Value Date   ALT 13 01/03/2024   AST 11 01/03/2024   ALKPHOS 63 01/03/2024   BILITOT 0.5 01/03/2024   Lab Results  Component Value Date   HGBA1C 5.3 01/03/2024   HGBA1C 5.6 08/16/2023   HGBA1C 5.9 (H) 05/11/2023   Lab Results  Component Value Date   INSULIN  14.6 01/03/2024   INSULIN  16.6 08/16/2023   INSULIN  7.1 05/11/2023   Lab Results  Component  Value Date   TSH 3.380 01/03/2024   Lab Results  Component Value Date   CHOL 208 (H) 01/03/2024   HDL 48 01/03/2024   LDLCALC 140 (H) 01/03/2024   TRIG 111 01/03/2024   CHOLHDL 4.3 01/03/2024   Lab Results  Component Value Date   VD25OH 55.7 01/03/2024   VD25OH 70.5 08/16/2023   VD25OH 67.7 05/11/2023   Lab Results  Component Value Date   WBC 6.6 05/11/2023   HGB 14.0 05/11/2023   HCT 42.3 05/11/2023   MCV 91 05/11/2023   PLT 249 05/11/2023   Lab Results  Component Value Date   IRON 123 12/24/2020   TIBC 326 12/24/2020   FERRITIN 138 12/24/2020   Attestation Statements:   Reviewed by clinician on day of visit: allergies, medications, problem list, medical history, surgical history, family history, social history, and previous encounter notes.  I have reviewed the above documentation for accuracy and completeness, and  I agree with the above. -  Dashana Guizar d. Allix Blomquist, NP-C

## 2024-01-31 NOTE — Telephone Encounter (Signed)
 Message from Plan Request Reference Number: [id number]. ZEPBOUND INJ 2.5/0.5 is approved through 03/08/2024. Your patient may now fill this prescription and it will be covered.. Authorization Expiration Date: March 08, 2024.

## 2024-02-28 ENCOUNTER — Encounter (INDEPENDENT_AMBULATORY_CARE_PROVIDER_SITE_OTHER): Payer: Self-pay | Admitting: Adult Health

## 2024-02-28 ENCOUNTER — Ambulatory Visit (INDEPENDENT_AMBULATORY_CARE_PROVIDER_SITE_OTHER): Payer: Self-pay | Admitting: Adult Health

## 2024-02-28 VITALS — BP 142/83 | HR 58 | Temp 98.2°F | Ht 67.0 in | Wt 233.0 lb

## 2024-02-28 DIAGNOSIS — E669 Obesity, unspecified: Secondary | ICD-10-CM

## 2024-02-28 DIAGNOSIS — Z6841 Body Mass Index (BMI) 40.0 and over, adult: Secondary | ICD-10-CM

## 2024-02-28 DIAGNOSIS — R7303 Prediabetes: Secondary | ICD-10-CM | POA: Diagnosis not present

## 2024-02-28 DIAGNOSIS — E559 Vitamin D deficiency, unspecified: Secondary | ICD-10-CM

## 2024-02-28 DIAGNOSIS — R112 Nausea with vomiting, unspecified: Secondary | ICD-10-CM | POA: Diagnosis not present

## 2024-02-28 DIAGNOSIS — Z6837 Body mass index (BMI) 37.0-37.9, adult: Secondary | ICD-10-CM

## 2024-02-28 MED ORDER — ZEPBOUND 7.5 MG/0.5ML ~~LOC~~ SOAJ: 7.5000 mg | SUBCUTANEOUS | 0 refills | Status: DC

## 2024-02-28 MED ORDER — ONDANSETRON HCL 8 MG PO TABS: 8.0000 mg | ORAL_TABLET | Freq: Three times a day (TID) | ORAL | 1 refills | Status: AC | PRN

## 2024-02-28 NOTE — Progress Notes (Signed)
 WEIGHT SUMMARY AND BIOMETRICS  No data recorded Anthropometric Measurements Height: 5' 7 (1.702 m) Weight at Last Visit: 238lb Starting Weight: 268lb Peak Weight: 272lb   No data recorded Other Clinical Data RMR: 2002 Fasting: No Labs: No Today's Visit #: 12 Starting Date: 05/11/23    Chief Complaint:   OBESITY Holly Johnson is here to discuss her progress with her obesity treatment plan.  She is on the the Category 2 Plan and states she is following her eating plan approximately 50 % of the time.  She states she is exercising NEAT Activities.  Interim History:  She started weekly Zepbound  2.5mg  on/about 09/08/2023 10/25/2023 Zepbound  2.5mg  increased to 5 mg 01/31/2024 Zepbound  5mg  increased to 7.5mg   She has had two injections at the increased strength of 7.5 mg  She administers on Thursday or Friday. She will experience nausea without vomiting for 12-48 hrs after injection. She is managing slower colon evacuation with increased fruits and vegetables.  Denies mass in neck, dysphagia, dyspepsia, persistent hoarseness, abdominal pain.  Patient was counseled on the importance of maintaining healthy lifestyle habits, including balanced nutrition, regular physical activity, and behavioral modifications, while taking antiobesity medication.  Patient verbalized understanding that medication is an adjunct to, not a replacement for, lifestyle changes and that the long-term success and weight maintenance depend on continued adherence to these strategies.  She plans to maintain my wt in Dec   Her husband is now on currently Mounjaro  5mg - experiencing constipation  Subjective:   1. Prediabetes - new onset Lab Results  Component Value Date   HGBA1C 5.3 01/03/2024   HGBA1C 5.6 08/16/2023   HGBA1C 5.9 (H) 05/11/2023     Latest Reference Range & Units 05/11/23 11:09 08/16/23 10:22 01/03/24 09:27  INSULIN  2.6 - 24.9 uIU/mL 7.1 16.6 14.6   She started weekly Zepbound  2.5mg   on/about 09/08/2023 10/25/2023 Zepbound  2.5mg  increased to 5 mg 01/31/2024 Zepbound  5mg  increased to 7.5mg   She has had two injections at the increased strength of 7.5 mg  She administers on Thursday or Friday. She will experience nausea without vomiting for 12-48 hrs after injection. She is managing slower colon evacuation with increased fruits and vegetables.  Denies mass in neck, dysphagia, dyspepsia, persistent hoarseness, abdominal pain.  Patient was counseled on the importance of maintaining healthy lifestyle habits, including balanced nutrition, regular physical activity, and behavioral modifications, while taking antiobesity medication.  Patient verbalized understanding that medication is an adjunct to, not a replacement for, lifestyle changes and that the long-term success and weight maintenance depend on continued adherence to these strategies.  2. Vitamin D  deficiency  Latest Reference Range & Units 05/11/23 11:09 08/16/23 10:22 01/03/24 09:27  Vitamin D , 25-Hydroxy 30.0 - 100.0 ng/mL 67.7 70.5 55.7   Vit D Level stable  3. N/V She denies hx of prolonged QT Last EKG- Normal - reviewed in EPIC  She started weekly Zepbound  2.5mg  on/about 09/08/2023 10/25/2023 Zepbound  2.5mg  increased to 5 mg 01/31/2024 Zepbound  5mg  increased to 7.5mg   She has had two injections at the increased strength of 7.5 mg  She administers on Thursday or Friday. She will experience nausea without vomiting for 12-48 hrs after injection. She is managing slower colon evacuation with increased fruits and vegetables.  Denies mass in neck, dysphagia, dyspepsia, persistent hoarseness, abdominal pain.  Assessment/Plan:   1. Prediabetes - new onset (Primary) Continue healthy eating and NEAT Activitities  2. Vitamin D  deficiency Monitor Labs  3. N/V Do not overeat Remain well hydrated Start ondansetron  (  ZOFRAN) 8 MG tablet Take 1 tablet (8 mg total) by mouth every 8 (eight) hours as needed for nausea  or vomiting. Dispense: 20 tablet, Refills: 1 ordered   4. BMI 40.0-44.9, adult (HCC) - Current BMI 37.3 Refill  tirzepatide  (ZEPBOUND ) 7.5 MG/0.5ML Pen Inject 7.5 mg into the skin once a week. Dispense: 6 mL, Refills: 0 ordered   Holly Johnson is currently in the action stage of change. As such, her goal is to continue with weight loss efforts. She has agreed to the Category 2 Plan.   Exercise goals: All adults should avoid inactivity. Some physical activity is better than none, and adults who participate in any amount of physical activity gain some health benefits. Adults should also include muscle-strengthening activities that involve all major muscle groups on 2 or more days a week. NEAT Activities  Behavioral modification strategies: increasing lean protein intake, decreasing simple carbohydrates, increasing vegetables, increasing water intake, no skipping meals, meal planning and cooking strategies, keeping healthy foods in the home, ways to avoid boredom eating, and planning for success.  Holly Johnson has agreed to follow-up with our clinic in 4 weeks. She was informed of the importance of frequent follow-up visits to maximize her success with intensive lifestyle modifications for her multiple health conditions.   Objective:   Height 5' 7 (1.702 m). Body mass index is 37.28 kg/m.  General: Cooperative, alert, well developed, in no acute distress. HEENT: Conjunctivae and lids unremarkable. Cardiovascular: Regular rhythm.  Lungs: Normal work of breathing. Neurologic: No focal deficits.   Lab Results  Component Value Date   CREATININE 0.97 01/03/2024   BUN 13 01/03/2024   NA 139 01/03/2024   K 4.2 01/03/2024   CL 101 01/03/2024   CO2 22 01/03/2024   Lab Results  Component Value Date   ALT 13 01/03/2024   AST 11 01/03/2024   ALKPHOS 63 01/03/2024   BILITOT 0.5 01/03/2024   Lab Results  Component Value Date   HGBA1C 5.3 01/03/2024   HGBA1C 5.6 08/16/2023   HGBA1C 5.9 (H)  05/11/2023   Lab Results  Component Value Date   INSULIN  14.6 01/03/2024   INSULIN  16.6 08/16/2023   INSULIN  7.1 05/11/2023   Lab Results  Component Value Date   TSH 3.380 01/03/2024   Lab Results  Component Value Date   CHOL 208 (H) 01/03/2024   HDL 48 01/03/2024   LDLCALC 140 (H) 01/03/2024   TRIG 111 01/03/2024   CHOLHDL 4.3 01/03/2024   Lab Results  Component Value Date   VD25OH 55.7 01/03/2024   VD25OH 70.5 08/16/2023   VD25OH 67.7 05/11/2023   Lab Results  Component Value Date   WBC 6.6 05/11/2023   HGB 14.0 05/11/2023   HCT 42.3 05/11/2023   MCV 91 05/11/2023   PLT 249 05/11/2023   Lab Results  Component Value Date   IRON 123 12/24/2020   TIBC 326 12/24/2020   FERRITIN 138 12/24/2020   Attestation Statements:   Reviewed by clinician on day of visit: allergies, medications, problem list, medical history, surgical history, family history, social history, and previous encounter notes.  I have reviewed the above documentation for accuracy and completeness, and I agree with the above. -  Alzada Brazee d. Snow Peoples, NP-C

## 2024-03-08 ENCOUNTER — Other Ambulatory Visit: Payer: Self-pay | Admitting: Family Medicine

## 2024-03-08 DIAGNOSIS — E039 Hypothyroidism, unspecified: Secondary | ICD-10-CM

## 2024-03-09 NOTE — Telephone Encounter (Signed)
 Requested Prescriptions  Pending Prescriptions Disp Refills   SYNTHROID  88 MCG tablet [Pharmacy Med Name: SYNTHROID     TAB 88MCG] 90 tablet 0    Sig: TAKE 1 TABLET (88 MCG TOTAL) DAILY BEFORE BREAKFAST ON AN EMPTY STOMACH WAIT 30 MINUTES BEFORE TAKING OTHER MEDS FOR HYPOTHYROIDISM     Endocrinology:  Hypothyroid Agents Passed - 03/09/2024  3:50 PM      Passed - TSH in normal range and within 360 days    TSH  Date Value Ref Range Status  01/03/2024 3.380 0.450 - 4.500 uIU/mL Final         Passed - Valid encounter within last 12 months    Recent Outpatient Visits           7 months ago Healthcare maintenance   [facility], Jason J, MD

## 2024-03-27 ENCOUNTER — Ambulatory Visit (INDEPENDENT_AMBULATORY_CARE_PROVIDER_SITE_OTHER): Admitting: Adult Health

## 2024-03-27 ENCOUNTER — Encounter (INDEPENDENT_AMBULATORY_CARE_PROVIDER_SITE_OTHER): Payer: Self-pay | Admitting: Adult Health

## 2024-03-27 VITALS — BP 125/79 | HR 67 | Temp 97.7°F | Ht 67.0 in | Wt 232.0 lb

## 2024-03-27 DIAGNOSIS — R7303 Prediabetes: Secondary | ICD-10-CM

## 2024-03-27 DIAGNOSIS — R112 Nausea with vomiting, unspecified: Secondary | ICD-10-CM

## 2024-03-27 DIAGNOSIS — Z6836 Body mass index (BMI) 36.0-36.9, adult: Secondary | ICD-10-CM

## 2024-03-27 DIAGNOSIS — Z0000 Encounter for general adult medical examination without abnormal findings: Secondary | ICD-10-CM

## 2024-03-27 DIAGNOSIS — Z6841 Body Mass Index (BMI) 40.0 and over, adult: Secondary | ICD-10-CM

## 2024-03-27 DIAGNOSIS — E669 Obesity, unspecified: Secondary | ICD-10-CM

## 2024-03-27 DIAGNOSIS — E559 Vitamin D deficiency, unspecified: Secondary | ICD-10-CM

## 2024-03-27 MED ORDER — ZEPBOUND 7.5 MG/0.5ML ~~LOC~~ SOAJ: 7.5000 mg | SUBCUTANEOUS | 0 refills | Status: AC

## 2024-03-27 NOTE — Progress Notes (Signed)
 "    WEIGHT SUMMARY AND BIOMETRICS  Vitals Temp: 97.7 F (36.5 C) BP: 125/79 Pulse Rate: 67 SpO2: 100 %   Anthropometric Measurements Height: 5' 7 (1.702 m) Weight: 232 lb (105.2 kg) BMI (Calculated): 36.33 Weight at Last Visit: 233 lb Weight Lost Since Last Visit: 1lb Weight Gained Since Last Visit: 0lb Starting Weight: 268 lb Total Weight Loss (lbs): 36 lb (16.3 kg) Peak Weight: 272 lb   Body Composition  Body Fat %: 44.3 % Fat Mass (lbs): 103 lbs Muscle Mass (lbs): 123 lbs Total Body Water (lbs): 86.2 lbs Visceral Fat Rating : 12   Other Clinical Data Fasting: Yes Labs: No Today's Visit #: 13 Starting Date: 05/11/23    Chief Complaint:   OBESITY Holly Johnson is here to discuss her progress with her obesity treatment plan.  She is on the the Category 2 Plan and states she is following her eating plan approximately 25 % of the time.  She states she is exercising: None  Interim History:  Her family has been travelling and celebrating over the holiday break. She is very pleased to have lost weight over the last month.  She administers Zepbound  7.5mg  on either Friday or Saturday. She has only needed 2 doses of PRN Zofran  in last 4 weeks.  Homework: think about sustainable activities for regular exercise regime to begin in 2026.   Subjective:   1. Prediabetes - new onset She started weekly Zepbound  2.5mg  on/about 09/08/2023 10/25/2023 Zepbound  2.5mg  increased to 5 mg 01/31/2024 Zepbound  5mg  increased to 7.5mg   2. Vitamin D  deficiency She endorses stable energy levels  3. Nausea and vomiting, unspecified vomiting type She administers Zepbound  7.5mg  on either Friday or Saturday. She has only needed 2 doses of PRN Zofran  in last 4 weeks. Of note- no hx of QT Prolongation in EKGs  4. Healthcare maintenance She started weekly Zepbound  2.5mg  on/about 09/08/2023 10/25/2023 Zepbound  2.5mg  increased to 5 mg 01/31/2024 Zepbound  5mg  increased to  7.5mg   Assessment/Plan:   1. Prediabetes - new onset (Primary) Increase compliance on Cat 2 MP to at least 85% Think about sustainable activities for regular exercise regime to begin in 2026.  2. Vitamin D  deficiency Monitor Labs  3. Nausea and vomiting, unspecified vomiting type Do not overeat Continue PRN Zofran   4. Healthcare maintenance Refill tirzepatide  (ZEPBOUND ) 7.5 MG/0.5ML Pen Inject 7.5 mg into the skin once a week. Dispense: 6 mL, Refills: 0 ordered  Increase compliance on Cat 2 MP to at least 85% Think about sustainable activities for regular exercise regime to begin in 2026.  5. BMI 40.0-44.9, adult (HCC) - Current BMI 36.4 Refill tirzepatide  (ZEPBOUND ) 7.5 MG/0.5ML Pen Inject 7.5 mg into the skin once a week. Dispense: 6 mL, Refills: 0 ordered   Holly Johnson is not currently in the action stage of change. As such, her goal is to get back to weightloss efforts . She has agreed to the Category 2 Plan.   Exercise goals: All adults should avoid inactivity. Some physical activity is better than none, and adults who participate in any amount of physical activity gain some health benefits. Adults should also include muscle-strengthening activities that involve all major muscle groups on 2 or more days a week.  Behavioral modification strategies: increasing lean protein intake, decreasing simple carbohydrates, increasing vegetables, increasing water intake, no skipping meals, meal planning and cooking strategies, keeping healthy foods in the home, better snacking choices, holiday eating strategies , celebration eating strategies, and planning for success.  Holly Johnson has agreed  to follow-up with our clinic in 4 weeks. She was informed of the importance of frequent follow-up visits to maximize her success with intensive lifestyle modifications for her multiple health conditions.   Objective:   Blood pressure 125/79, pulse 67, temperature 97.7 F (36.5 C), height 5' 7 (1.702 m),  weight 232 lb (105.2 kg), SpO2 100%. Body mass index is 36.34 kg/m.  General: Cooperative, alert, well developed, in no acute distress. HEENT: Conjunctivae and lids unremarkable. Cardiovascular: Regular rhythm.  Lungs: Normal work of breathing. Neurologic: No focal deficits.   Lab Results  Component Value Date   CREATININE 0.97 01/03/2024   BUN 13 01/03/2024   NA 139 01/03/2024   K 4.2 01/03/2024   CL 101 01/03/2024   CO2 22 01/03/2024   Lab Results  Component Value Date   ALT 13 01/03/2024   AST 11 01/03/2024   ALKPHOS 63 01/03/2024   BILITOT 0.5 01/03/2024   Lab Results  Component Value Date   HGBA1C 5.3 01/03/2024   HGBA1C 5.6 08/16/2023   HGBA1C 5.9 (H) 05/11/2023   Lab Results  Component Value Date   INSULIN  14.6 01/03/2024   INSULIN  16.6 08/16/2023   INSULIN  7.1 05/11/2023   Lab Results  Component Value Date   TSH 3.380 01/03/2024   Lab Results  Component Value Date   CHOL 208 (H) 01/03/2024   HDL 48 01/03/2024   LDLCALC 140 (H) 01/03/2024   TRIG 111 01/03/2024   CHOLHDL 4.3 01/03/2024   Lab Results  Component Value Date   VD25OH 55.7 01/03/2024   VD25OH 70.5 08/16/2023   VD25OH 67.7 05/11/2023   Lab Results  Component Value Date   WBC 6.6 05/11/2023   HGB 14.0 05/11/2023   HCT 42.3 05/11/2023   MCV 91 05/11/2023   PLT 249 05/11/2023   Lab Results  Component Value Date   IRON 123 12/24/2020   TIBC 326 12/24/2020   FERRITIN 138 12/24/2020   Attestation Statements:   Reviewed by clinician on day of visit: allergies, medications, problem list, medical history, surgical history, family history, social history, and previous encounter notes.  I have reviewed the above documentation for accuracy and completeness, and I agree with the above. -  Darina Hartwell d. Ronit Marczak, NP-C "

## 2024-04-24 ENCOUNTER — Encounter (INDEPENDENT_AMBULATORY_CARE_PROVIDER_SITE_OTHER): Payer: Self-pay | Admitting: Adult Health

## 2024-04-25 ENCOUNTER — Ambulatory Visit (INDEPENDENT_AMBULATORY_CARE_PROVIDER_SITE_OTHER): Admitting: Adult Health

## 2024-04-27 ENCOUNTER — Encounter (INDEPENDENT_AMBULATORY_CARE_PROVIDER_SITE_OTHER): Payer: Self-pay | Admitting: Adult Health

## 2024-04-28 ENCOUNTER — Encounter (INDEPENDENT_AMBULATORY_CARE_PROVIDER_SITE_OTHER): Payer: Self-pay | Admitting: Adult Health

## 2024-04-28 NOTE — Telephone Encounter (Signed)
 FYI

## 2024-05-24 ENCOUNTER — Ambulatory Visit (INDEPENDENT_AMBULATORY_CARE_PROVIDER_SITE_OTHER): Admitting: Adult Health

## 2024-06-05 ENCOUNTER — Ambulatory Visit (INDEPENDENT_AMBULATORY_CARE_PROVIDER_SITE_OTHER): Admitting: Physician Assistant
